# Patient Record
Sex: Female | Born: 2000 | Race: White | Hispanic: No | Smoking: Never smoker
Health system: Southern US, Community
[De-identification: ages and names within clinical notes are randomized; demographics above are authoritative.]

## PROBLEM LIST (undated history)

## (undated) DIAGNOSIS — J45909 Unspecified asthma, uncomplicated: Secondary | ICD-10-CM

## (undated) DIAGNOSIS — F419 Anxiety disorder, unspecified: Secondary | ICD-10-CM

## (undated) DIAGNOSIS — F32A Depression, unspecified: Secondary | ICD-10-CM

## (undated) HISTORY — DX: Depression, unspecified: F32.A

## (undated) HISTORY — DX: Anxiety disorder, unspecified: F41.9

## (undated) HISTORY — DX: Unspecified asthma, uncomplicated: J45.909

---
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
## 2019-07-23 ENCOUNTER — Ambulatory Visit: Payer: Self-pay | Attending: Internal Medicine

## 2019-07-23 ENCOUNTER — Ambulatory Visit: Payer: Self-pay

## 2019-07-23 DIAGNOSIS — Z23 Encounter for immunization: Secondary | ICD-10-CM

## 2019-07-23 NOTE — Progress Notes (Signed)
   Covid-19 Vaccination Clinic  Name:  Deborah Lopez    MRN: [id number] DOB: Feb 27, 2001  07/23/2019  Deborah Lopez was observed post Covid-19 immunization for 15 minutes without incident. She was provided with Vaccine Information Sheet and instruction to access the V-Safe system.   Deborah Lopez was instructed to call 911 with any severe reactions post vaccine: Marland Kitchen Difficulty breathing  . Swelling of face and throat  . A fast heartbeat  . A bad rash all over body  . Dizziness and weakness   Immunizations Administered    Name Date Dose VIS Date Route   Pfizer COVID-19 Vaccine 07/23/2019 12:01 PM 0.3 mL 03/18/2019 Intramuscular   Manufacturer: [employer], [employer]   Lot: [id number]   NDC: [address]-3

## 2019-08-23 ENCOUNTER — Ambulatory Visit: Payer: Self-pay | Attending: Internal Medicine

## 2019-08-23 DIAGNOSIS — Z23 Encounter for immunization: Secondary | ICD-10-CM

## 2019-08-23 NOTE — Progress Notes (Signed)
   Covid-19 Vaccination Clinic  Name:  LUISE YAMAMOTO    MRN: [id number] DOB: 2001/03/11  08/23/2019  Ms. Budreau was observed post Covid-19 immunization for 15 minutes without incident. She was provided with Vaccine Information Sheet and instruction to access the V-Safe system.   Ms. Bump was instructed to call 911 with any severe reactions post vaccine: Marland Kitchen Difficulty breathing  . Swelling of face and throat  . A fast heartbeat  . A bad rash all over body  . Dizziness and weakness   Immunizations Administered    Name Date Dose VIS Date Route   Pfizer COVID-19 Vaccine 08/23/2019  1:16 PM 0.3 mL 06/01/2018 Intramuscular   Manufacturer: [employer], [employer]   Lot: [id number]   NDC: [address]-8

## 2020-07-31 ENCOUNTER — Ambulatory Visit
Admission: EM | Admit: 2020-07-31 | Discharge: 2020-07-31 | Disposition: A | Discharge status: Home / Self Care | Payer: BLUE CROSS/BLUE SHIELD | Attending: Family Medicine | Admitting: Family Medicine

## 2020-07-31 ENCOUNTER — Encounter: Payer: Self-pay | Admitting: Emergency Medicine

## 2020-07-31 ENCOUNTER — Other Ambulatory Visit: Payer: Self-pay

## 2020-07-31 DIAGNOSIS — J0100 Acute maxillary sinusitis, unspecified: Secondary | ICD-10-CM | POA: Diagnosis not present

## 2020-07-31 MED ORDER — AMOXICILLIN-POT CLAVULANATE 875-125 MG PO TABS: 1.0000 | ORAL_TABLET | Freq: Two times a day (BID) | ORAL | 0 refills | Status: DC

## 2020-07-31 MED ORDER — FLUCONAZOLE 150 MG PO TABS: 150.0000 mg | ORAL_TABLET | Freq: Once | ORAL | 0 refills | Status: AC

## 2020-07-31 MED ORDER — CETIRIZINE-PSEUDOEPHEDRINE ER 5-120 MG PO TB12: 1.0000 | ORAL_TABLET | Freq: Two times a day (BID) | ORAL | 0 refills | Status: DC

## 2020-07-31 NOTE — ED Triage Notes (Signed)
Pt c/o nasal congestion, sore on the roof of her mouth, cough, and headache. Started about a week ago. Home covid test negative.

## 2020-07-31 NOTE — ED Provider Notes (Signed)
[facility]    CSN: [id number] Arrival date & time: 07/31/20  1340      History   Chief Complaint Chief Complaint  Patient presents with  . Nasal Congestion   HPI  20 year old female presents respiratory symptoms.  1 week history of symptoms.  She reports runny nose, congestion, sore throat, sores in roof her mouth.  Associated headache.  Also notes sinus pressure.  Home COVID test negative.  No fever.  Pain currently 2/10 in severity.  No relieving factors.  No medications or interventions tried.  No other complaints.  Home Medications    Prior to Admission medications   Medication Sig Start Date End Date Taking? Authorizing Provider  amoxicillin-clavulanate (AUGMENTIN) 875-125 MG tablet Take 1 tablet by mouth 2 (two) times daily. 07/31/20  Yes Shasta Chinn G, DO  cetirizine-pseudoephedrine (ZYRTEC-D) 5-120 MG tablet Take 1 tablet by mouth 2 (two) times daily. 07/31/20  Yes Pilar Westergaard G, DO  fluconazole (DIFLUCAN) 150 MG tablet Take 1 tablet (150 mg total) by mouth once for 1 dose. Repeat dose in 72 hours. 07/31/20 07/31/20 Yes Oma Alpert G, DO  JUNEL FE 1.5/30 1.5-30 MG-MCG tablet Take 1 tablet by mouth daily. 06/19/20   [provider]   Social History Social History   Tobacco Use  . Smoking status: Never Smoker  . Smokeless tobacco: Never Used  Vaping Use  . Vaping Use: Never used  Substance Use Topics  . Alcohol use: Not Currently  . Drug use: Not Currently     Allergies   Patient has no known allergies.   Review of Systems Review of Systems Per HPI  Physical Exam Triage Vital Signs ED Triage Vitals  Enc Vitals Group     BP 07/31/20 1400 124/77     Pulse Rate 07/31/20 1400 88     Resp 07/31/20 1400 18     Temp 07/31/20 1400 98.5 F (36.9 C)     Temp Source 07/31/20 1400 Oral     SpO2 07/31/20 1400 100 %     Weight 07/31/20 1403 175 lb (79.4 kg)     Height 07/31/20 1403 5\' 2"  (1.575 m)     Head Circumference --      Peak Flow --       Pain Score 07/31/20 1414 2     Pain Loc --      Pain Edu? --      Excl. in [place]? --    No data found.  Updated Vital Signs BP 124/77 (BP Location: Left Arm)   Pulse 88   Temp 98.5 F (36.9 C) (Oral)   Resp 18   Ht 5\' 2"  (1.575 m)   Wt 79.4 kg   LMP 07/10/2020 (Approximate)   SpO2 100%   BMI 32.01 kg/m   Visual Acuity Right Eye Distance:   Left Eye Distance:   Bilateral Distance:    Right Eye Near:   Left Eye Near:    Bilateral Near:     Physical Exam Vitals and nursing note reviewed.  Constitutional:      General: She is not in acute distress.    Appearance: Normal appearance. She is not ill-appearing.  HENT:     Head: Normocephalic and atraumatic.     Right Ear: Tympanic membrane normal.     Left Ear: Tympanic membrane normal.     Nose:     Comments: Maxillary sinus tenderness to palpation.    Mouth/Throat:     Pharynx: Oropharynx is  clear. No posterior oropharyngeal erythema.  Cardiovascular:     Rate and Rhythm: Normal rate and regular rhythm.     Heart sounds: No murmur heard.   Pulmonary:     Effort: Pulmonary effort is normal.     Breath sounds: Normal breath sounds. No wheezing, rhonchi or rales.  Musculoskeletal:     Cervical back: Neck supple.  Neurological:     Mental Status: She is alert.  Psychiatric:        Mood and Affect: Mood normal.        Behavior: Behavior normal.    UC Treatments / Results  Labs (all labs ordered are listed, but only abnormal results are displayed) Labs Reviewed - No data to display  EKG   Radiology No results found.  Procedures Procedures (including critical care time)  Medications Ordered in UC Medications - No data to display  Initial Impression / Assessment and Plan / UC Course  I have reviewed the triage vital signs and the nursing notes.  Pertinent labs & imaging results that were available during my care of the patient were reviewed by me and considered in my medical decision making (see  chart for details).    20 year old female presents with sinusitis.  Treating with Augmentin.  Also placing on Zyrtec-D. Diflucan if needed (for yeast vaginitis from antibiotic use).  Final Clinical Impressions(s) / UC Diagnoses   Final diagnoses:  Acute maxillary sinusitis, recurrence not specified   Discharge Instructions   None    ED Prescriptions    Medication Sig Dispense Auth. Provider   amoxicillin-clavulanate (AUGMENTIN) 875-125 MG tablet Take 1 tablet by mouth 2 (two) times daily. 20 tablet Marjarie Irion G, DO   fluconazole (DIFLUCAN) 150 MG tablet Take 1 tablet (150 mg total) by mouth once for 1 dose. Repeat dose in 72 hours. 2 tablet Niobe Dick G, DO   cetirizine-pseudoephedrine (ZYRTEC-D) 5-120 MG tablet Take 1 tablet by mouth 2 (two) times daily. 30 tablet Tommie Sams, DO     PDMP not reviewed this encounter.   Tommie Sams, [place] 07/31/20 1858

## 2021-05-24 DIAGNOSIS — Z01419 Encounter for gynecological examination (general) (routine) without abnormal findings: Secondary | ICD-10-CM | POA: Diagnosis not present

## 2021-05-24 DIAGNOSIS — Z1339 Encounter for screening examination for other mental health and behavioral disorders: Secondary | ICD-10-CM | POA: Diagnosis not present

## 2021-05-24 DIAGNOSIS — Z124 Encounter for screening for malignant neoplasm of cervix: Secondary | ICD-10-CM | POA: Diagnosis not present

## 2021-05-24 DIAGNOSIS — R69 Illness, unspecified: Secondary | ICD-10-CM | POA: Diagnosis not present

## 2021-05-24 DIAGNOSIS — Z1331 Encounter for screening for depression: Secondary | ICD-10-CM | POA: Diagnosis not present

## 2021-05-24 DIAGNOSIS — Z3041 Encounter for surveillance of contraceptive pills: Secondary | ICD-10-CM | POA: Diagnosis not present

## 2021-06-04 ENCOUNTER — Other Ambulatory Visit: Payer: Self-pay

## 2021-06-04 ENCOUNTER — Ambulatory Visit (LOCAL_COMMUNITY_HEALTH_CENTER): Payer: Self-pay

## 2021-06-04 DIAGNOSIS — Z111 Encounter for screening for respiratory tuberculosis: Secondary | ICD-10-CM

## 2021-06-07 ENCOUNTER — Ambulatory Visit (LOCAL_COMMUNITY_HEALTH_CENTER): Payer: Self-pay

## 2021-06-07 ENCOUNTER — Other Ambulatory Visit: Payer: Self-pay

## 2021-06-07 DIAGNOSIS — Z111 Encounter for screening for respiratory tuberculosis: Secondary | ICD-10-CM

## 2021-06-07 LAB — TB SKIN TEST
Induration: 0 mm
TB Skin Test: NEGATIVE

## 2021-06-20 DIAGNOSIS — J019 Acute sinusitis, unspecified: Secondary | ICD-10-CM | POA: Diagnosis not present

## 2021-07-04 DIAGNOSIS — Z00129 Encounter for routine child health examination without abnormal findings: Secondary | ICD-10-CM | POA: Diagnosis not present

## 2021-07-04 DIAGNOSIS — Z0000 Encounter for general adult medical examination without abnormal findings: Secondary | ICD-10-CM | POA: Diagnosis not present

## 2021-07-04 DIAGNOSIS — Z713 Dietary counseling and surveillance: Secondary | ICD-10-CM | POA: Diagnosis not present

## 2021-07-04 DIAGNOSIS — Z23 Encounter for immunization: Secondary | ICD-10-CM | POA: Diagnosis not present

## 2021-08-26 ENCOUNTER — Ambulatory Visit: Payer: 59 | Admitting: Internal Medicine

## 2021-08-26 ENCOUNTER — Encounter: Payer: Self-pay | Admitting: Internal Medicine

## 2021-08-26 DIAGNOSIS — F439 Reaction to severe stress, unspecified: Secondary | ICD-10-CM | POA: Diagnosis not present

## 2021-08-26 DIAGNOSIS — Z124 Encounter for screening for malignant neoplasm of cervix: Secondary | ICD-10-CM | POA: Diagnosis not present

## 2021-08-26 DIAGNOSIS — R69 Illness, unspecified: Secondary | ICD-10-CM | POA: Diagnosis not present

## 2021-08-26 DIAGNOSIS — J45909 Unspecified asthma, uncomplicated: Secondary | ICD-10-CM

## 2021-08-26 MED ORDER — SERTRALINE HCL 50 MG PO TABS: ORAL_TABLET | ORAL | 2 refills | Status: DC

## 2021-08-26 NOTE — Progress Notes (Signed)
Patient ID: Deborah Lopez, female   DOB: Jun 04, 2000, 21 y.o.   MRN: [id number]   Subjective:    Patient ID: Deborah Lopez, female    DOB: 07-10-2000, 21 y.o.   MRN: [id number]   Patient here to establish care.   Chief Complaint  Patient presents with   Establish Care    Pt presents to establish care   .   HPI Was being followed at [facility] -  Dr Princess Bruins. Diagnosed with asthma - elementary school.  No problems now.  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  Some sleep issues.  Discussed exercise.  [employer].  Yoga.  No sob.  No cough or congestion.  No acid reflux.  No abdominal pain.  Bowels moving.  Request to see gyn - Dr Jean Rosenthal - for pelvic exams.    Past Medical History:  Diagnosis Date   Anxiety    Asthma    Depression    History reviewed. No pertinent surgical history. Family History  Problem Relation Age of Onset   Arthritis Mother    Depression Mother    Hypertension Mother    Mental illness Mother    Miscarriages / [place] Mother    Alcohol abuse Father    Depression Father    Diabetes Father    Early death Father    Mental illness Father    Asthma Sister    Depression Sister    Anxiety disorder Sister    Miscarriages / [place] Sister    Mental illness Sister    Arthritis Maternal Grandmother    Cancer Maternal Grandfather    Heart attack Maternal Grandfather    Mental illness Paternal Grandmother    Social History   Socioeconomic History   Marital status: Single    Spouse name: Not on file   Number of children: Not on file   Years of education: Not on file   Highest education level: Not on file  Occupational History   Not on file  Tobacco Use   Smoking status: Never   Smokeless tobacco: Never  Vaping Use   Vaping Use: Never used  Substance and Sexual Activity   Alcohol use: Yes   Drug use: Never   Sexual activity: Not Currently    Birth control/protection: Pill    Comment: Junel  Other Topics Concern   Not on file   Social History Narrative   Not on file   Social Determinants of Health   Financial Resource Strain: Not on file  Food Insecurity: Not on file  Transportation Needs: Not on file  Physical Activity: Not on file  Stress: Not on file  Social Connections: Not on file     Review of Systems  Constitutional:  Negative for appetite change and unexpected weight change.  HENT:  Negative for congestion and sinus pressure.   Respiratory:  Negative for cough, chest tightness and shortness of breath.   Cardiovascular:  Negative for chest pain and palpitations.  Gastrointestinal:  Negative for abdominal pain, diarrhea, nausea and vomiting.  Genitourinary:  Negative for difficulty urinating and dysuria.  Musculoskeletal:  Negative for joint swelling and myalgias.  Skin:  Negative for color change and rash.  Neurological:  Negative for dizziness, light-headedness and headaches.  Psychiatric/Behavioral:  Negative for agitation and dysphoric mood.       Objective:     BP 124/70 (BP Location: Left Arm, Patient Position: Sitting, Cuff Size: Small)   Pulse 99   Temp (!) 97.1 F (36.2 C) (  Temporal)   Resp 16   Ht 5\' 1"  (1.549 m)   Wt 171 lb 6.4 oz (77.7 kg)   SpO2 98%   BMI 32.39 kg/m  Wt Readings from Last 3 Encounters:  08/26/21 171 lb 6.4 oz (77.7 kg)  07/31/20 175 lb (79.4 kg)    Physical Exam Vitals reviewed.  Constitutional:      General: She is not in acute distress.    Appearance: Normal appearance.  HENT:     Head: Normocephalic and atraumatic.     Right Ear: External ear normal.     Left Ear: External ear normal.  Eyes:     General: No scleral icterus.       Right eye: No discharge.        Left eye: No discharge.     Conjunctiva/sclera: Conjunctivae normal.  Neck:     Thyroid: No thyromegaly.  Cardiovascular:     Rate and Rhythm: Normal rate and regular rhythm.  Pulmonary:     Effort: No respiratory distress.     Breath sounds: Normal breath sounds. No wheezing.   Abdominal:     General: Bowel sounds are normal.     Palpations: Abdomen is soft.     Tenderness: There is no abdominal tenderness.  Musculoskeletal:        General: No swelling or tenderness.     Cervical back: Neck supple. No tenderness.  Lymphadenopathy:     Cervical: No cervical adenopathy.  Skin:    Findings: No erythema or rash.  Neurological:     Mental Status: She is alert.  Psychiatric:        Mood and Affect: Mood normal.        Behavior: Behavior normal.     Outpatient Encounter Medications as of 08/26/2021  Medication Sig   JUNEL FE 1.5/30 1.5-30 MG-MCG tablet Take 1 tablet by mouth daily.   sertraline (ZOLOFT) 50 MG tablet 1/2 tablet q day x 10 days and then one po q day   [DISCONTINUED] amoxicillin-clavulanate (AUGMENTIN) 875-125 MG tablet Take 1 tablet by mouth 2 (two) times daily. (Patient not taking: Reported on 08/26/2021)   [DISCONTINUED] cetirizine-pseudoephedrine (ZYRTEC-D) 5-120 MG tablet Take 1 tablet by mouth 2 (two) times daily. (Patient not taking: Reported on 08/26/2021)   No facility-administered encounter medications on file as of 08/26/2021.         Assessment & Plan:   Problem List Items Addressed This Visit     Asthma    Diagnosed as a child.  No problems now.  Follow.        Pap smear for cervical cancer screening    Request referral to gyn - Dr 08/28/2021 - for pelvic and pap smear.        Stress    Increased stress.  Discussed.  Discussed sleep. Does not feel needs any further intervention at this time.  Follow.          Jean Rosenthal, MD

## 2021-09-02 ENCOUNTER — Encounter: Payer: Self-pay | Admitting: Internal Medicine

## 2021-09-02 DIAGNOSIS — J45909 Unspecified asthma, uncomplicated: Secondary | ICD-10-CM | POA: Insufficient documentation

## 2021-09-02 DIAGNOSIS — Z124 Encounter for screening for malignant neoplasm of cervix: Secondary | ICD-10-CM | POA: Insufficient documentation

## 2021-09-02 DIAGNOSIS — F439 Reaction to severe stress, unspecified: Secondary | ICD-10-CM | POA: Insufficient documentation

## 2021-09-02 NOTE — Assessment & Plan Note (Signed)
Request referral to gyn - Dr Glennon Mac - for pelvic and pap smear.

## 2021-09-02 NOTE — Assessment & Plan Note (Signed)
Diagnosed as a child.  No problems now.  Follow.

## 2021-09-02 NOTE — Assessment & Plan Note (Signed)
Increased stress.  Discussed.  Discussed sleep. Does not feel needs any further intervention at this time.  Follow.

## 2021-09-05 DIAGNOSIS — H109 Unspecified conjunctivitis: Secondary | ICD-10-CM | POA: Diagnosis not present

## 2021-09-17 ENCOUNTER — Other Ambulatory Visit: Payer: Self-pay | Admitting: Internal Medicine

## 2021-09-27 ENCOUNTER — Ambulatory Visit
Admission: RE | Admit: 2021-09-27 | Discharge: 2021-09-27 | Disposition: A | Discharge status: Home / Self Care | Payer: 59 | Source: Ambulatory Visit | Attending: Physician Assistant | Admitting: Physician Assistant

## 2021-09-27 VITALS — BP 117/80 | HR 106 | Temp 98.6°F | Resp 18

## 2021-09-27 DIAGNOSIS — J0100 Acute maxillary sinusitis, unspecified: Secondary | ICD-10-CM

## 2021-09-27 DIAGNOSIS — R0981 Nasal congestion: Secondary | ICD-10-CM | POA: Diagnosis not present

## 2021-09-27 MED ORDER — AMOXICILLIN-POT CLAVULANATE 875-125 MG PO TABS: 1.0000 | ORAL_TABLET | Freq: Two times a day (BID) | ORAL | 0 refills | Status: AC

## 2021-10-09 ENCOUNTER — Ambulatory Visit: Payer: 59 | Admitting: Internal Medicine

## 2021-12-21 ENCOUNTER — Ambulatory Visit: Payer: Self-pay

## 2022-01-09 DIAGNOSIS — J019 Acute sinusitis, unspecified: Secondary | ICD-10-CM | POA: Diagnosis not present

## 2022-01-09 DIAGNOSIS — L0100 Impetigo, unspecified: Secondary | ICD-10-CM | POA: Diagnosis not present

## 2022-01-09 DIAGNOSIS — J029 Acute pharyngitis, unspecified: Secondary | ICD-10-CM | POA: Diagnosis not present

## 2022-01-09 DIAGNOSIS — R509 Fever, unspecified: Secondary | ICD-10-CM | POA: Diagnosis not present

## 2022-01-12 ENCOUNTER — Ambulatory Visit
Admission: EM | Admit: 2022-01-12 | Discharge: 2022-01-12 | Disposition: A | Discharge status: Home / Self Care | Payer: 59 | Attending: Emergency Medicine | Admitting: Emergency Medicine

## 2022-01-12 ENCOUNTER — Encounter: Payer: Self-pay | Admitting: Emergency Medicine

## 2022-01-12 DIAGNOSIS — J0100 Acute maxillary sinusitis, unspecified: Secondary | ICD-10-CM

## 2022-01-12 MED ORDER — AMOXICILLIN-POT CLAVULANATE 875-125 MG PO TABS: 1.0000 | ORAL_TABLET | Freq: Two times a day (BID) | ORAL | 0 refills | Status: AC

## 2022-01-12 MED ORDER — BENZONATATE 100 MG PO CAPS: 200.0000 mg | ORAL_CAPSULE | Freq: Three times a day (TID) | ORAL | 0 refills | Status: DC

## 2022-01-12 MED ORDER — PROMETHAZINE-DM 6.25-15 MG/5ML PO SYRP: 5.0000 mL | ORAL_SOLUTION | Freq: Four times a day (QID) | ORAL | 0 refills | Status: DC | PRN

## 2022-01-12 MED ORDER — IPRATROPIUM BROMIDE 0.06 % NA SOLN: 2.0000 | Freq: Four times a day (QID) | NASAL | 12 refills | Status: DC

## 2022-01-12 NOTE — ED Provider Notes (Signed)
[facility]    CSN: [id number] Arrival date & time: 01/12/22  1042      History   Chief Complaint Chief Complaint  Patient presents with   Nasal Congestion   Sinus Problem    HPI Deborah Lopez is a 21 y.o. female.   HPI  21 year old female here for evaluation of sinus complaints.  Patient reports that she has been experiencing sinus pain and pressure for last 2 weeks with thick green nasal discharge, postnasal drip, sore throat, and a productive cough for green sputum.  She denies any fever, ear pain, shortness of breath, or wheezing.  Past Medical History:  Diagnosis Date   Anxiety    Asthma    Depression     Patient Active Problem List   Diagnosis Date Noted   Asthma 09/02/2021   Stress 09/02/2021   Pap smear for cervical cancer screening 09/02/2021    History reviewed. No pertinent surgical history.  OB History   No obstetric history on file.      Home Medications    Prior to Admission medications   Medication Sig Start Date End Date Taking? Authorizing Provider  amoxicillin-clavulanate (AUGMENTIN) 875-125 MG tablet Take 1 tablet by mouth every 12 (twelve) hours for 10 days. 01/12/22 01/22/22 Yes Margarette Canada, NP  benzonatate (TESSALON) 100 MG capsule Take 2 capsules (200 mg total) by mouth every 8 (eight) hours. 01/12/22  Yes Margarette Canada, NP  ipratropium (ATROVENT) 0.06 % nasal spray Place 2 sprays into both nostrils 4 (four) times daily. 01/12/22  Yes Margarette Canada, NP  JUNEL FE 1.5/30 1.5-30 MG-MCG tablet Take 1 tablet by mouth daily. 06/19/20  Yes [provider]  promethazine-dextromethorphan (PROMETHAZINE-DM) 6.25-15 MG/5ML syrup Take 5 mLs by mouth 4 (four) times daily as needed. 01/12/22  Yes Margarette Canada, NP  sertraline (ZOLOFT) 50 MG tablet 1/2 TABLET EVERY DAY X 10 DAYS AND THEN ONE EVERY DAY 09/17/21  Yes Einar Pheasant, MD    Family History Family History  Problem Relation Age of Onset   Arthritis Mother    Depression  Mother    Hypertension Mother    Mental illness Mother    Miscarriages / [place] Mother    Alcohol abuse Father    Depression Father    Diabetes Father    Early death Father    Mental illness Father    Asthma Sister    Depression Sister    Anxiety disorder Sister    Miscarriages / [place] Sister    Mental illness Sister    Arthritis Maternal Grandmother    Cancer Maternal Grandfather    Heart attack Maternal Grandfather    Mental illness Paternal Grandmother     Social History Social History   Tobacco Use   Smoking status: Never   Smokeless tobacco: Never  Vaping Use   Vaping Use: Never used  Substance Use Topics   Alcohol use: Yes   Drug use: Never     Allergies   Patient has no known allergies.   Review of Systems Review of Systems  Constitutional:  Negative for fever.  HENT:  Positive for congestion, postnasal drip, rhinorrhea, sinus pressure, sinus pain and sore throat. Negative for ear pain.   Respiratory:  Positive for cough. Negative for shortness of breath and wheezing.      Physical Exam Triage Vital Signs ED Triage Vitals  Enc Vitals Group     BP 01/12/22 1052 112/75     Pulse Rate 01/12/22 1052 93  Resp 01/12/22 1052 14     Temp 01/12/22 1052 98.6 F (37 C)     Temp Source 01/12/22 1052 Oral     SpO2 01/12/22 1052 98 %     Weight 01/12/22 1049 168 lb (76.2 kg)     Height 01/12/22 1049 5' 1"  (1.549 m)     Head Circumference --      Peak Flow --      Pain Score 01/12/22 1049 8     Pain Loc --      Pain Edu? --      Excl. in [place]? --    No data found.  Updated Vital Signs BP 112/75 (BP Location: Right Arm)   Pulse 93   Temp 98.6 F (37 C) (Oral)   Resp 14   Ht 5' 1"  (1.549 m)   Wt 168 lb (76.2 kg)   LMP 12/29/2021 (Approximate)   SpO2 98%   BMI 31.74 kg/m   Visual Acuity Right Eye Distance:   Left Eye Distance:   Bilateral Distance:    Right Eye Near:   Left Eye Near:    Bilateral Near:     Physical  Exam Vitals and nursing note reviewed.  Constitutional:      Appearance: Normal appearance. She is not ill-appearing.  HENT:     Head: Normocephalic and atraumatic.     Right Ear: Tympanic membrane, ear canal and external ear normal. There is no impacted cerumen.     Left Ear: Tympanic membrane, ear canal and external ear normal. There is no impacted cerumen.     Nose: Congestion and rhinorrhea present.     Comments: Patient has marked edema and erythema of nasal mucosa bilaterally.  There is some purulent drainage in both nares as well.    Mouth/Throat:     Mouth: Mucous membranes are moist.     Pharynx: Oropharynx is clear. Posterior oropharyngeal erythema present. No oropharyngeal exudate.     Comments: Posterior oropharynx has erythema and injection.  Tonsillar pillars are unremarkable. Cardiovascular:     Rate and Rhythm: Normal rate and regular rhythm.     Pulses: Normal pulses.     Heart sounds: Normal heart sounds. No murmur heard.    No friction rub. No gallop.  Pulmonary:     Effort: Pulmonary effort is normal.     Breath sounds: Normal breath sounds. No wheezing, rhonchi or rales.  Musculoskeletal:     Cervical back: Normal range of motion and neck supple.  Lymphadenopathy:     Cervical: No cervical adenopathy.  Skin:    General: Skin is warm and dry.     Capillary Refill: Capillary refill takes less than 2 seconds.     Findings: No erythema or rash.  Neurological:     General: No focal deficit present.     Mental Status: She is alert and oriented to person, place, and time.  Psychiatric:        Mood and Affect: Mood normal.        Behavior: Behavior normal.        Thought Content: Thought content normal.        Judgment: Judgment normal.      UC Treatments / Results  Labs (all labs ordered are listed, but only abnormal results are displayed) Labs Reviewed - No data to display  EKG   Radiology No results found.  Procedures Procedures (including  critical care time)  Medications Ordered in UC Medications - No data to  display  Initial Impression / Assessment and Plan / UC Course  I have reviewed the triage vital signs and the nursing notes.  Pertinent labs & imaging results that were available during my care of the patient were reviewed by me and considered in my medical decision making (see chart for details).   Patient is a pleasant, nontoxic-appearing 21 year old female with a history of asthma and anxiety presenting for 2 weeks worth of sinus pain and pressure with purulent nasal discharge.  On exam patient has inflammation of her nasal mucosa and purulent discharge in both nares.  She also has erythema in the posterior oropharynx.  No lymphadenopathy on exam in the cervical region and her lungs are clear to auscultation.  I suspect the patient has a sinus infection and I will treat her for such with Augmentin twice daily for 10 days.  Also give her some Atrovent nasal spray to help with the nasal congestion and I suggest sinus irrigation to help alleviate the mucus burden.  I believe her cough is coming from drainage and I will treat her cough with Tessalon Perles and Promethazine DM cough syrup.  She denies need for work note.  Return precautions reviewed.   Final Clinical Impressions(s) / UC Diagnoses   Final diagnoses:  Acute non-recurrent maxillary sinusitis     Discharge Instructions      The Augmentin twice daily with food for 10 days for treatment of your sinusitis.  Perform sinus irrigation 2-3 times a day with a NeilMed sinus rinse kit and distilled water.  Do not use tap water.  You can use plain over-the-counter Mucinex every 6 hours to break up the stickiness of the mucus so your body can clear it.  Increase your oral fluid intake to thin out your mucus so that is also able for your body to clear more easily.  Take an over-the-counter probiotic, such as Culturelle-align-activia, 1 hour after each dose of  antibiotic to prevent diarrhea.  Use the Atrovent nasal spray, 2 squirts in each nostril every 6 hours, as needed for runny nose and postnasal drip.  Use the Tessalon Perles every 8 hours during the day.  Take them with a small sip of water.  They may give you some numbness to the base of your tongue or a metallic taste in your mouth, this is normal.  Use the Promethazine DM cough syrup at bedtime for cough and congestion.  It will make you drowsy so do not take it during the day.  If you develop any new or worsening symptoms return for reevaluation or see your primary care provider.      ED Prescriptions     Medication Sig Dispense Auth. Provider   amoxicillin-clavulanate (AUGMENTIN) 875-125 MG tablet Take 1 tablet by mouth every 12 (twelve) hours for 10 days. 20 tablet Margarette Canada, NP   ipratropium (ATROVENT) 0.06 % nasal spray Place 2 sprays into both nostrils 4 (four) times daily. 15 mL Margarette Canada, NP   benzonatate (TESSALON) 100 MG capsule Take 2 capsules (200 mg total) by mouth every 8 (eight) hours. 21 capsule Margarette Canada, NP   promethazine-dextromethorphan (PROMETHAZINE-DM) 6.25-15 MG/5ML syrup Take 5 mLs by mouth 4 (four) times daily as needed. 118 mL Margarette Canada, NP      PDMP not reviewed this encounter.   Margarette Canada, NP 01/12/22 1106

## 2022-01-12 NOTE — ED Triage Notes (Signed)
Patient c/o sinus congestion and pressure and nasal congestion that started 2 weeks ago.  Patient denies fevers.

## 2022-01-12 NOTE — Discharge Instructions (Signed)
The Augmentin twice daily with food for 10 days for treatment of your sinusitis.  Perform sinus irrigation 2-3 times a day with a NeilMed sinus rinse kit and distilled water.  Do not use tap water.  You can use plain over-the-counter Mucinex every 6 hours to break up the stickiness of the mucus so your body can clear it.  Increase your oral fluid intake to thin out your mucus so that is also able for your body to clear more easily.  Take an over-the-counter probiotic, such as Culturelle-align-activia, 1 hour after each dose of antibiotic to prevent diarrhea.  Use the Atrovent nasal spray, 2 squirts in each nostril every 6 hours, as needed for runny nose and postnasal drip.  Use the Tessalon Perles every 8 hours during the day.  Take them with a small sip of water.  They may give you some numbness to the base of your tongue or a metallic taste in your mouth, this is normal.  Use the Promethazine DM cough syrup at bedtime for cough and congestion.  It will make you drowsy so do not take it during the day.  If you develop any new or worsening symptoms return for reevaluation or see your primary care provider.  

## 2022-02-11 DIAGNOSIS — R87615 Unsatisfactory cytologic smear of cervix: Secondary | ICD-10-CM | POA: Diagnosis not present

## 2022-02-11 LAB — HM PAP SMEAR: HM Pap smear: NORMAL

## 2022-02-26 ENCOUNTER — Ambulatory Visit: Payer: 59 | Admitting: Internal Medicine

## 2022-03-18 ENCOUNTER — Ambulatory Visit
Admission: RE | Admit: 2022-03-18 | Discharge: 2022-03-18 | Disposition: A | Discharge status: Home / Self Care | Payer: 59 | Source: Ambulatory Visit | Attending: Internal Medicine | Admitting: Internal Medicine

## 2022-03-18 VITALS — BP 112/73 | HR 90 | Temp 98.0°F | Resp 16 | Ht 61.0 in | Wt 169.0 lb

## 2022-03-18 DIAGNOSIS — J029 Acute pharyngitis, unspecified: Secondary | ICD-10-CM | POA: Diagnosis not present

## 2022-03-18 DIAGNOSIS — J0100 Acute maxillary sinusitis, unspecified: Secondary | ICD-10-CM

## 2022-03-18 LAB — GROUP A STREP BY PCR: Group A Strep by PCR: NOT DETECTED

## 2022-03-18 MED ORDER — CLARITHROMYCIN ER 500 MG PO TB24: 1000.0000 mg | ORAL_TABLET | Freq: Every day | ORAL | 0 refills | Status: DC

## 2022-03-18 MED ORDER — BENZONATATE 200 MG PO CAPS: 200.0000 mg | ORAL_CAPSULE | Freq: Two times a day (BID) | ORAL | 0 refills | Status: DC | PRN

## 2022-03-18 NOTE — ED Triage Notes (Signed)
Chief Complaint: sore throat, runny nose, productive cough with green mucus. No fever, chills, or body aches.   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

## 2022-03-18 NOTE — ED Provider Notes (Signed)
[facility]    CSN: [id number] Arrival date & time: 03/18/22  1853      History   Chief Complaint Chief Complaint  Patient presents with   Sore Throat    I have had a sore throat for a week now and sinus congestion for three weeks. My cough has gotten very congested. I am constantly coughing, having a sore throat, and blowing my nose. - Entered by patient   URI    HPI Deborah Lopez is a 21 y.o. female who presents due to having URI onset 2.5 weeks ago. ST x 1 week, and nose congestion x 2 weeks of green color. The cough has been keeping her up at night. She denies having fever or aches at all.     Past Medical History:  Diagnosis Date   Anxiety    Asthma    Depression     Patient Active Problem List   Diagnosis Date Noted   Asthma 09/02/2021   Stress 09/02/2021   Pap smear for cervical cancer screening 09/02/2021    History reviewed. No pertinent surgical history.  OB History   No obstetric history on file.      Home Medications    Prior to Admission medications   Medication Sig Start Date End Date Taking? Authorizing Provider  benzonatate (TESSALON) 200 MG capsule Take 1 capsule (200 mg total) by mouth 2 (two) times daily as needed for cough. 03/18/22  Yes Rodriguez-Southworth, Nettie Elm, PA-C  clarithromycin (BIAXIN XL) 500 MG 24 hr tablet Take 2 tablets (1,000 mg total) by mouth daily. 03/18/22  Yes Rodriguez-Southworth, Nettie Elm, PA-C  JUNEL FE 1.5/30 1.5-30 MG-MCG tablet Take 1 tablet by mouth daily. 06/19/20  Yes [provider]  sertraline (ZOLOFT) 50 MG tablet 1/2 TABLET EVERY DAY X 10 DAYS AND THEN ONE EVERY DAY 09/17/21  Yes Dale Radium, MD    Family History Family History  Problem Relation Age of Onset   Arthritis Mother    Depression Mother    Hypertension Mother    Mental illness Mother    Miscarriages / [place] Mother    Alcohol abuse Father    Depression Father    Diabetes Father    Early death Father    Mental  illness Father    Asthma Sister    Depression Sister    Anxiety disorder Sister    Miscarriages / [place] Sister    Mental illness Sister    Arthritis Maternal Grandmother    Cancer Maternal Grandfather    Heart attack Maternal Grandfather    Mental illness Paternal Grandmother     Social History Social History   Tobacco Use   Smoking status: Never   Smokeless tobacco: Never  Vaping Use   Vaping Use: Never used  Substance Use Topics   Alcohol use: Yes   Drug use: Never     Allergies   Patient has no known allergies.   Review of Systems Review of Systems  Constitutional:  Positive for fatigue. Negative for activity change, appetite change, chills, diaphoresis and fever.  HENT:  Positive for congestion, postnasal drip, rhinorrhea, sinus pressure, sinus pain and sore throat. Negative for ear discharge and ear pain.   Eyes:  Negative for discharge.  Respiratory:  Positive for cough.   Musculoskeletal:  Negative for myalgias.  Neurological:  Negative for headaches.     Physical Exam Triage Vital Signs ED Triage Vitals  Enc Vitals Group     BP 03/18/22 1917 112/73  Pulse Rate 03/18/22 1917 90     Resp 03/18/22 1917 16     Temp 03/18/22 1917 98 F (36.7 C)     Temp Source 03/18/22 1917 Oral     SpO2 03/18/22 1917 100 %     Weight 03/18/22 1919 169 lb (76.7 kg)     Height 03/18/22 1919 5\' 1"  (1.549 m)     Head Circumference --      Peak Flow --      Pain Score 03/18/22 1916 8     Pain Loc --      Pain Edu? --      Excl. in [place]? --    No data found.  Updated Vital Signs BP 112/73 (BP Location: Left Arm)   Pulse 90   Temp 98 F (36.7 C) (Oral)   Resp 16   Ht 5\' 1"  (1.549 m)   Wt 169 lb (76.7 kg)   LMP 02/27/2022 (Approximate)   SpO2 100%   BMI 31.93 kg/m   Visual Acuity Right Eye Distance:   Left Eye Distance:   Bilateral Distance:    Right Eye Near:   Left Eye Near:    Bilateral Near:      Physical Exam Vitals signs and nursing note  reviewed.  Constitutional:      General: She is not in acute distress.    Appearance: Normal appearance. She is not ill-appearing, toxic-appearing or diaphoretic.  HENT:     Head: Normocephalic.     Right Ear: Tympanic membrane, ear canal and external ear normal.     Left Ear: Tympanic membrane, ear canal and external ear normal.     Nose: with moderate congestion and yellow mucous on each side. Has tender bilateral maxillary sinuses      Mouth/Throat: clear    Mouth: Mucous membranes are moist.  Eyes:     General: No scleral icterus.       Right eye: No discharge.        Left eye: No discharge.     Conjunctiva/sclera: Conjunctivae normal.  Neck:     Musculoskeletal: Neck supple. No neck rigidity.  Cardiovascular:     Rate and Rhythm: Normal rate and regular rhythm.     Heart sounds: No murmur.  Pulmonary:     Effort: Pulmonary effort is normal.     Breath sounds: Normal breath sounds.  Musculoskeletal: Normal range of motion.  Lymphadenopathy:     Cervical: No cervical adenopathy.  Skin:    General: Skin is warm and dry.     Coloration: Skin is not jaundiced.     Findings: No rash.  Neurological:     Mental Status: She is alert and oriented to person, place, and time.     Gait: Gait normal.  Psychiatric:        Mood and Affect: Mood normal.        Behavior: Behavior normal.        Thought Content: Thought content normal.        Judgment: Judgment normal.    UC Treatments / Results  Labs (all labs ordered are listed, but only abnormal results are displayed) Labs Reviewed  GROUP A STREP BY PCR  Strep PCR negative  EKG   Radiology No results found.  Procedures Procedures (including critical care time)  Medications Ordered in UC Medications - No data to display  Initial Impression / Assessment and Plan / UC Course  I have reviewed the triage vital signs and the  nursing notes.  Pertinent labs results that were available during my care of the patient were  reviewed by me and considered in my medical decision making (see chart for details).  Maxillary sinusitis Bronchitis  I placed her on Tessalon and Biaxen as noted.    Final Clinical Impressions(s) / UC Diagnoses   Final diagnoses:  Acute non-recurrent maxillary sinusitis   Discharge Instructions   None    ED Prescriptions     Medication Sig Dispense Auth. Provider   clarithromycin (BIAXIN XL) 500 MG 24 hr tablet Take 2 tablets (1,000 mg total) by mouth daily. 7 tablet Rodriguez-Southworth, Florence Yeung, PA-C   benzonatate (TESSALON) 200 MG capsule Take 1 capsule (200 mg total) by mouth 2 (two) times daily as needed for cough. 30 capsule Rodriguez-Southworth, Nettie Elm, [place]      I have reviewed the PDMP during this encounter.   Garey Ham, [place] 03/18/22 2054

## 2022-03-22 ENCOUNTER — Other Ambulatory Visit: Payer: Self-pay | Admitting: Internal Medicine

## 2022-03-25 ENCOUNTER — Encounter: Payer: Self-pay | Admitting: *Deleted

## 2022-03-25 NOTE — Telephone Encounter (Signed)
Sent My chart message needs appt for further refills.

## 2022-04-16 ENCOUNTER — Other Ambulatory Visit: Payer: Self-pay | Admitting: Internal Medicine

## 2022-04-28 ENCOUNTER — Other Ambulatory Visit: Payer: Self-pay

## 2022-04-28 ENCOUNTER — Ambulatory Visit
Admission: RE | Admit: 2022-04-28 | Discharge: 2022-04-28 | Disposition: A | Discharge status: Home / Self Care | Payer: 59 | Source: Ambulatory Visit | Attending: Emergency Medicine | Admitting: Emergency Medicine

## 2022-04-28 VITALS — BP 122/85 | HR 90 | Temp 98.7°F | Resp 16 | Ht 61.0 in | Wt 168.9 lb

## 2022-04-28 DIAGNOSIS — J0140 Acute pansinusitis, unspecified: Secondary | ICD-10-CM | POA: Diagnosis not present

## 2022-04-28 MED ORDER — PROMETHAZINE-DM 6.25-15 MG/5ML PO SYRP: 5.0000 mL | ORAL_SOLUTION | Freq: Four times a day (QID) | ORAL | 0 refills | Status: DC | PRN

## 2022-04-28 MED ORDER — IPRATROPIUM BROMIDE 0.06 % NA SOLN: 2.0000 | Freq: Four times a day (QID) | NASAL | 12 refills | Status: DC

## 2022-04-28 MED ORDER — BENZONATATE 100 MG PO CAPS: 200.0000 mg | ORAL_CAPSULE | Freq: Three times a day (TID) | ORAL | 0 refills | Status: DC

## 2022-04-28 MED ORDER — AMOXICILLIN-POT CLAVULANATE 875-125 MG PO TABS: 1.0000 | ORAL_TABLET | Freq: Two times a day (BID) | ORAL | 0 refills | Status: AC

## 2022-04-28 NOTE — ED Triage Notes (Signed)
Pt with cough productive of green phlegm, headache "from coughing so hard",  sore throat, nasal drainage. Pt was diagnosed with bronchitis a month ago. Most recent sx for 3 weeks.

## 2022-04-28 NOTE — Discharge Instructions (Addendum)
The Augmentin twice daily with food for 10 days for treatment of your sinusitis.  Perform sinus irrigation 2-3 times a day with a NeilMed sinus rinse kit and distilled water.  Do not use tap water.  You can use plain over-the-counter Mucinex every 6 hours to break up the stickiness of the mucus so your body can clear it.  Increase your oral fluid intake to thin out your mucus so that is also able for your body to clear more easily.  Take an over-the-counter probiotic, such as Culturelle-align-activia, 1 hour after each dose of antibiotic to prevent diarrhea.  Use the Atrovent nasal spray, 2 squirts in each nostril every 6 hours, as needed for runny nose and postnasal drip.  Use the Tessalon Perles every 8 hours during the day.  Take them with a small sip of water.  They may give you some numbness to the base of your tongue or a metallic taste in your mouth, this is normal.  Use the Promethazine DM cough syrup at bedtime for cough and congestion.  It will make you drowsy so do not take it during the day.  If you develop any new or worsening symptoms return for reevaluation or see your primary care provider.  

## 2022-04-28 NOTE — ED Provider Notes (Signed)
[facility]    CSN: [id number] Arrival date & time: 04/28/22  1541      History   Chief Complaint Chief Complaint  Patient presents with   Cough   Nasal Congestion   Sore Throat    HPI PIETRINA JAGODZINSKI is a 22 y.o. female.   HPI  22 year old female here for evaluation of respiratory complaints.  The patient reports that for the last 3 weeks she has been experiencing upper and lower sinus pressure with nasal congestion and green nasal discharge, sore throat, headache, and a cough that is productive for green sputum.  She also reports that she is had intermittent fevers but does not have one currently.  She denies any ear pain, shortness of breath, or wheezing.  Past Medical History:  Diagnosis Date   Anxiety    Asthma    Depression     Patient Active Problem List   Diagnosis Date Noted   Asthma 09/02/2021   Stress 09/02/2021   Pap smear for cervical cancer screening 09/02/2021    History reviewed. No pertinent surgical history.  OB History   No obstetric history on file.      Home Medications    Prior to Admission medications   Medication Sig Start Date End Date Taking? Authorizing Provider  amoxicillin-clavulanate (AUGMENTIN) 875-125 MG tablet Take 1 tablet by mouth every 12 (twelve) hours for 10 days. 04/28/22 05/08/22 Yes Becky Augusta, NP  benzonatate (TESSALON) 100 MG capsule Take 2 capsules (200 mg total) by mouth every 8 (eight) hours. 04/28/22  Yes Becky Augusta, NP  ipratropium (ATROVENT) 0.06 % nasal spray Place 2 sprays into both nostrils 4 (four) times daily. 04/28/22  Yes Becky Augusta, NP  promethazine-dextromethorphan (PROMETHAZINE-DM) 6.25-15 MG/5ML syrup Take 5 mLs by mouth 4 (four) times daily as needed. 04/28/22  Yes Becky Augusta, NP  clarithromycin (BIAXIN XL) 500 MG 24 hr tablet Take 2 tablets (1,000 mg total) by mouth daily. 03/18/22   Rodriguez-Southworth, Nettie Elm, PA-C  JUNEL FE 1.5/30 1.5-30 MG-MCG tablet Take 1 tablet by mouth daily.  06/19/20   [provider]  sertraline (ZOLOFT) 50 MG tablet TAKE 1/2 TABLET BY MOUTH DAILY X 10 DAYS AND THEN ONE EVERY DAY 04/16/22   Dale [place], MD    Family History Family History  Problem Relation Age of Onset   Arthritis Mother    Depression Mother    Hypertension Mother    Mental illness Mother    Miscarriages / [place] Mother    Alcohol abuse Father    Depression Father    Diabetes Father    Early death Father    Mental illness Father    Asthma Sister    Depression Sister    Anxiety disorder Sister    Miscarriages / [place] Sister    Mental illness Sister    Arthritis Maternal Grandmother    Cancer Maternal Grandfather    Heart attack Maternal Grandfather    Mental illness Paternal Grandmother     Social History Social History   Tobacco Use   Smoking status: Never   Smokeless tobacco: Never  Vaping Use   Vaping Use: Never used  Substance Use Topics   Alcohol use: Yes    Comment: social   Drug use: Never     Allergies   Patient has no known allergies.   Review of Systems Review of Systems  Constitutional:  Positive for fever.  HENT:  Positive for congestion, rhinorrhea, sinus pressure, sinus pain and sore throat.  Negative for ear pain.   Respiratory:  Positive for cough. Negative for shortness of breath and wheezing.   Hematological: Negative.   Psychiatric/Behavioral: Negative.       Physical Exam Triage Vital Signs ED Triage Vitals  Enc Vitals Group     BP 04/28/22 1638 122/85     Pulse Rate 04/28/22 1638 90     Resp 04/28/22 1638 16     Temp 04/28/22 1638 98.7 F (37.1 C)     Temp Source 04/28/22 1638 Oral     SpO2 04/28/22 1638 100 %     Weight 04/28/22 1640 168 lb 14 oz (76.6 kg)     Height 04/28/22 1640 5\' 1"  (1.549 m)     Head Circumference --      Peak Flow --      Pain Score 04/28/22 1639 7     Pain Loc --      Pain Edu? --      Excl. in [place]? --    No data found.  Updated Vital Signs BP 122/85 (BP  Location: Left Arm)   Pulse 90   Temp 98.7 F (37.1 C) (Oral)   Resp 16   Ht 5\' 1"  (1.549 m)   Wt 168 lb 14 oz (76.6 kg)   LMP 04/21/2022   SpO2 100%   BMI 31.91 kg/m   Visual Acuity Right Eye Distance:   Left Eye Distance:   Bilateral Distance:    Right Eye Near:   Left Eye Near:    Bilateral Near:     Physical Exam Vitals and nursing note reviewed.  Constitutional:      Appearance: Normal appearance. She is not ill-appearing.  HENT:     Head: Normocephalic and atraumatic.     Right Ear: Tympanic membrane, ear canal and external ear normal. There is no impacted cerumen.     Left Ear: Tympanic membrane, ear canal and external ear normal. There is no impacted cerumen.     Nose: Congestion and rhinorrhea present.     Comments: Haze Rushing is erythematous and edematous with thick yellow discharge in both nares.  She has tenderness to compression of frontal and maxillary sinuses bilaterally.    Mouth/Throat:     Mouth: Mucous membranes are moist.     Pharynx: Oropharynx is clear. Posterior oropharyngeal erythema present. No oropharyngeal exudate.     Comments: Posterior oropharynx has erythema and injection with yellow postnasal drip. Cardiovascular:     Rate and Rhythm: Normal rate and regular rhythm.     Pulses: Normal pulses.     Heart sounds: Normal heart sounds. No murmur heard.    No friction rub. No gallop.  Pulmonary:     Effort: Pulmonary effort is normal.     Breath sounds: Normal breath sounds. No wheezing, rhonchi or rales.  Musculoskeletal:     Cervical back: Normal range of motion and neck supple.  Lymphadenopathy:     Cervical: No cervical adenopathy.  Skin:    General: Skin is warm and dry.     Capillary Refill: Capillary refill takes less than 2 seconds.     Findings: No erythema or rash.  Neurological:     General: No focal deficit present.     Mental Status: She is alert and oriented to person, place, and time.  Psychiatric:        Mood and Affect: Mood  normal.        Behavior: Behavior normal.  Thought Content: Thought content normal.        Judgment: Judgment normal.      UC Treatments / Results  Labs (all labs ordered are listed, but only abnormal results are displayed) Labs Reviewed - No data to display  EKG   Radiology No results found.  Procedures Procedures (including critical care time)  Medications Ordered in UC Medications - No data to display  Initial Impression / Assessment and Plan / UC Course  I have reviewed the triage vital signs and the nursing notes.  Pertinent labs & imaging results that were available during my care of the patient were reviewed by me and considered in my medical decision making (see chart for details).   Patient is a pleasant, nontoxic-appearing 22 year old female here for evaluation of 3 weeks worth of respiratory and sinus complaints as outlined HPI above.  On exam patient does have inflammation and erythema of her nasal mucosa with yellow discharge in both nares.  She does have tenderness to compression of frontal maxillary sinuses bilaterally.  There is yellow postnasal drip present on oropharyngeal exam as well.  Her cardiopulmonary exam reveals clear lung sounds in all fields.  I believe the patient has pansinusitis and I will treat her accordingly with Augmentin 875 twice daily for 10 days.  I believe her cough is being driven by her postnasal drip so I will prescribe Atrovent nasal spray that she can use 4 times a day to help with the nasal congestion and postnasal drip.  I have also prescribed Tessalon Perles and Promethazine DM cough syrup she can use for cough and congestion.  We discussed using sinus irrigation to help alleviate her mucus burden and facilitate a decrease in pressure in her sinuses cavities.  She denies need for work note.  Return precautions reviewed.   Final Clinical Impressions(s) / UC Diagnoses   Final diagnoses:  Acute non-recurrent pansinusitis      Discharge Instructions      The Augmentin twice daily with food for 10 days for treatment of your sinusitis.  Perform sinus irrigation 2-3 times a day with a NeilMed sinus rinse kit and distilled water.  Do not use tap water.  You can use plain over-the-counter Mucinex every 6 hours to break up the stickiness of the mucus so your body can clear it.  Increase your oral fluid intake to thin out your mucus so that is also able for your body to clear more easily.  Take an over-the-counter probiotic, such as Culturelle-align-activia, 1 hour after each dose of antibiotic to prevent diarrhea.  Use the Atrovent nasal spray, 2 squirts in each nostril every 6 hours, as needed for runny nose and postnasal drip.  Use the Tessalon Perles every 8 hours during the day.  Take them with a small sip of water.  They may give you some numbness to the base of your tongue or a metallic taste in your mouth, this is normal.  Use the Promethazine DM cough syrup at bedtime for cough and congestion.  It will make you drowsy so do not take it during the day.  If you develop any new or worsening symptoms return for reevaluation or see your primary care provider.      ED Prescriptions     Medication Sig Dispense Auth. Provider   amoxicillin-clavulanate (AUGMENTIN) 875-125 MG tablet Take 1 tablet by mouth every 12 (twelve) hours for 10 days. 20 tablet Becky Augusta, NP   benzonatate (TESSALON) 100 MG capsule Take 2 capsules (  200 mg total) by mouth every 8 (eight) hours. 21 capsule Margarette Canada, NP   ipratropium (ATROVENT) 0.06 % nasal spray Place 2 sprays into both nostrils 4 (four) times daily. 15 mL Margarette Canada, NP   promethazine-dextromethorphan (PROMETHAZINE-DM) 6.25-15 MG/5ML syrup Take 5 mLs by mouth 4 (four) times daily as needed. 118 mL Margarette Canada, NP      PDMP not reviewed this encounter.   Margarette Canada, NP 04/28/22 1705

## 2022-05-26 ENCOUNTER — Encounter: Payer: 59 | Admitting: Internal Medicine

## 2022-05-26 NOTE — Progress Notes (Deleted)
Subjective:    Patient ID: Deborah Lopez, female    DOB: [birth year], 22 y.o.   MRN: [id number]  Patient here for No chief complaint on file.   HPI Here for her physical exam.  Diagnosed with asthma - elementary school.   Seen 04/28/22 - urgent care - dx with pansinusitis.  Treated with augmentin and atrovent nasal spray and tessalon perles.  Saw Dr Glennon Mac - f/u pap - 02/11/22 - negative    Past Medical History:  Diagnosis Date   Anxiety    Asthma    Depression    No past surgical history on file. Family History  Problem Relation Age of Onset   Arthritis Mother    Depression Mother    Hypertension Mother    Mental illness Mother    Miscarriages / [place] Mother    Alcohol abuse Father    Depression Father    Diabetes Father    Early death Father    Mental illness Father    Asthma Sister    Depression Sister    Anxiety disorder Sister    35 / [place] Sister    Mental illness Sister    Arthritis Maternal Grandmother    Cancer Maternal Grandfather    Heart attack Maternal Grandfather    Mental illness Paternal Grandmother    Social History   Socioeconomic History   Marital status: Single    Spouse name: Not on file   Number of children: Not on file   Years of education: Not on file   Highest education level: Not on file  Occupational History   Not on file  Tobacco Use   Smoking status: Never   Smokeless tobacco: Never  Vaping Use   Vaping Use: Never used  Substance and Sexual Activity   Alcohol use: Yes    Comment: social   Drug use: Never   Sexual activity: Not Currently    Birth control/protection: Pill    Comment: Junel  Other Topics Concern   Not on file  Social History Narrative   Not on file   Social Determinants of Health   Financial Resource Strain: Not on file  Food Insecurity: Not on file  Transportation Needs: Not on file  Physical Activity: Not on file  Stress: Not on file  Social Connections: Not on file      Review of Systems     Objective:     LMP 04/21/2022  Wt Readings from Last 3 Encounters:  04/28/22 168 lb 14 oz (76.6 kg)  03/18/22 169 lb (76.7 kg)  01/12/22 168 lb (76.2 kg)    Physical Exam   Outpatient Encounter Medications as of 05/26/2022  Medication Sig   benzonatate (TESSALON) 100 MG capsule Take 2 capsules (200 mg total) by mouth every 8 (eight) hours.   clarithromycin (BIAXIN XL) 500 MG 24 hr tablet Take 2 tablets (1,000 mg total) by mouth daily.   ipratropium (ATROVENT) 0.06 % nasal spray Place 2 sprays into both nostrils 4 (four) times daily.   JUNEL FE 1.5/30 1.5-30 MG-MCG tablet Take 1 tablet by mouth daily.   promethazine-dextromethorphan (PROMETHAZINE-DM) 6.25-15 MG/5ML syrup Take 5 mLs by mouth 4 (four) times daily as needed.   sertraline (ZOLOFT) 50 MG tablet TAKE 1/2 TABLET BY MOUTH DAILY X 10 DAYS AND THEN ONE EVERY DAY   No facility-administered encounter medications on file as of 05/26/2022.     No results found for: "WBC", "HGB", "HCT", "PLT", "GLUCOSE", "CHOL", "TRIG", "HDL", "LDLDIRECT", "LDLCALC", "  ALT", "AST", "NA", "K", "CL", "CREATININE", "BUN", "CO2", "TSH", "PSA", "INR", "GLUF", "HGBA1C", "MICROALBUR"  No results found.     Assessment & Plan:  There are no diagnoses linked to this encounter.   Einar Pheasant, MD

## 2022-06-22 ENCOUNTER — Ambulatory Visit
Admission: RE | Admit: 2022-06-22 | Discharge: 2022-06-22 | Disposition: A | Discharge status: Home / Self Care | Payer: Medicaid Other | Source: Ambulatory Visit | Attending: Emergency Medicine | Admitting: Emergency Medicine

## 2022-06-22 VITALS — BP 116/83 | HR 103 | Temp 98.4°F | Ht 61.0 in | Wt 165.0 lb

## 2022-06-22 DIAGNOSIS — J0140 Acute pansinusitis, unspecified: Secondary | ICD-10-CM

## 2022-06-22 MED ORDER — PROMETHAZINE-DM 6.25-15 MG/5ML PO SYRP: 5.0000 mL | ORAL_SOLUTION | Freq: Four times a day (QID) | ORAL | 0 refills | Status: DC | PRN

## 2022-06-22 MED ORDER — AMOXICILLIN-POT CLAVULANATE 875-125 MG PO TABS: 1.0000 | ORAL_TABLET | Freq: Two times a day (BID) | ORAL | 0 refills | Status: AC

## 2022-06-22 MED ORDER — BENZONATATE 100 MG PO CAPS: 200.0000 mg | ORAL_CAPSULE | Freq: Three times a day (TID) | ORAL | 0 refills | Status: DC

## 2022-06-22 NOTE — ED Triage Notes (Signed)
Pt c/o cough, nasal congestion x2weeks  Pt works at a preschool

## 2022-06-22 NOTE — Discharge Instructions (Addendum)
Begin Augmentin every morning and every evening for 10 days, daily see improvement in about 48 hours and steady progression from there  May use Tessalon Perles every 8 hours as needed to help calm your coughing  You may use cough syrup additionally every 6 hours, This may make you feel drowsyYou can take Tylenol and/or Ibuprofen as needed for fever reduction and pain relief.   For cough: honey 1/2 to 1 teaspoon (you can dilute the honey in water or another fluid).  You can also use guaifenesin and dextromethorphan for cough. You can use a humidifier for chest congestion and cough.  If you don't have a humidifier, you can sit in the bathroom with the hot shower running.      For sore throat: try warm salt water gargles, cepacol lozenges, throat spray, warm tea or water with lemon/honey, popsicles or ice, or OTC cold relief medicine for throat discomfort.   For congestion: take a daily anti-histamine like Zyrtec, Claritin, and a oral decongestant, such as pseudoephedrine.  You can also use Flonase 1-2 sprays in each nostril daily.   It is important to stay hydrated: drink plenty of fluids (water, gatorade/powerade/pedialyte, juices, or teas) to keep your throat moisturized and help further relieve irritation/discomfort.

## 2022-06-22 NOTE — ED Provider Notes (Signed)
[facility]    CSN: [id number] Arrival date & time: 06/22/22  [id number]      History   Chief Complaint Chief Complaint  Patient presents with   Nasal Congestion    Entered by patient   Cough    HPI Deborah Lopez is a 22 y.o. female.   Patient presents for evaluation of nasal congestion, rhinorrhea, productive cough with green sputum, intermittent generalized headaches, sinus pain and pressure primarily underneath the eyes and a sore throat present for 2 weeks.  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  Tolerating food and liquids.  Has attempted use of ibuprofen and nasal spray which has been minimally effective.  Denies fevers, shortness of breath or wheezing.  History of asthma.   Past Medical History:  Diagnosis Date   Anxiety    Asthma    Depression     Patient Active Problem List   Diagnosis Date Noted   Asthma 09/02/2021   Stress 09/02/2021   Pap smear for cervical cancer screening 09/02/2021    History reviewed. No pertinent surgical history.  OB History   No obstetric history on file.      Home Medications    Prior to Admission medications   Medication Sig Start Date End Date Taking? Authorizing Provider  sertraline (ZOLOFT) 50 MG tablet TAKE 1/2 TABLET BY MOUTH DAILY X 10 DAYS AND THEN ONE EVERY DAY 04/16/22  Yes Einar Pheasant, MD  benzonatate (TESSALON) 100 MG capsule Take 2 capsules (200 mg total) by mouth every 8 (eight) hours. 04/28/22   Margarette Canada, NP  clarithromycin (BIAXIN XL) 500 MG 24 hr tablet Take 2 tablets (1,000 mg total) by mouth daily. 03/18/22   Rodriguez-Southworth, Sunday Spillers, PA-C  ipratropium (ATROVENT) 0.06 % nasal spray Place 2 sprays into both nostrils 4 (four) times daily. 04/28/22   Margarette Canada, NP  JUNEL FE 1.5/30 1.5-30 MG-MCG tablet Take 1 tablet by mouth daily. 06/19/20   [provider]  promethazine-dextromethorphan (PROMETHAZINE-DM) 6.25-15 MG/5ML syrup Take 5 mLs by mouth 4 (four) times daily as  needed. 04/28/22   Margarette Canada, NP    Family History Family History  Problem Relation Age of Onset   Arthritis Mother    Depression Mother    Hypertension Mother    Mental illness Mother    Miscarriages / [place] Mother    Alcohol abuse Father    Depression Father    Diabetes Father    Early death Father    Mental illness Father    Asthma Sister    Depression Sister    Anxiety disorder Sister    Miscarriages / [place] Sister    Mental illness Sister    Arthritis Maternal Grandmother    Cancer Maternal Grandfather    Heart attack Maternal Grandfather    Mental illness Paternal Grandmother     Social History Social History   Tobacco Use   Smoking status: Never   Smokeless tobacco: Never  Vaping Use   Vaping Use: Never used  Substance Use Topics   Alcohol use: Yes    Comment: social   Drug use: Never     Allergies   Patient has no known allergies.   Review of Systems Review of Systems  Constitutional: Negative.   HENT:  Positive for congestion, rhinorrhea, sinus pressure, sinus pain and sore throat. Negative for dental problem, drooling, ear discharge, ear pain, facial swelling, hearing loss, mouth sores, nosebleeds, postnasal drip, sneezing, tinnitus, trouble swallowing and voice change.  Respiratory:  Positive for cough. Negative for apnea, choking, chest tightness, shortness of breath, wheezing and stridor.   Cardiovascular: Negative.   Gastrointestinal: Negative.   Neurological:  Positive for headaches. Negative for dizziness, tremors, seizures, syncope, facial asymmetry, speech difficulty, weakness, light-headedness and numbness.     Physical Exam Triage Vital Signs ED Triage Vitals  Enc Vitals Group     BP 06/22/22 0941 116/83     Pulse Rate 06/22/22 0941 (!) 103     Resp --      Temp 06/22/22 0941 98.4 F (36.9 C)     Temp Source 06/22/22 0941 Oral     SpO2 06/22/22 0941 93 %     Weight 06/22/22 0939 165 lb (74.8 kg)     Height  06/22/22 0939 5\' 1"  (1.549 m)     Head Circumference --      Peak Flow --      Pain Score 06/22/22 0939 6     Pain Loc --      Pain Edu? --      Excl. in Impact? --    No data found.  Updated Vital Signs BP 116/83 (BP Location: Left Arm)   Pulse (!) 103   Temp 98.4 F (36.9 C) (Oral)   Ht 5\' 1"  (1.549 m)   Wt 165 lb (74.8 kg)   LMP 05/18/2022   SpO2 93%   BMI 31.18 kg/m   Visual Acuity Right Eye Distance:   Left Eye Distance:   Bilateral Distance:    Right Eye Near:   Left Eye Near:    Bilateral Near:     Physical Exam Constitutional:      Appearance: Normal appearance.  HENT:     Head: Normocephalic.     Right Ear: Tympanic membrane, ear canal and external ear normal.     Left Ear: Tympanic membrane, ear canal and external ear normal.     Nose: Congestion and rhinorrhea present.     Right Turbinates: Swollen.     Right Sinus: Maxillary sinus tenderness and frontal sinus tenderness present.     Left Sinus: Maxillary sinus tenderness and frontal sinus tenderness present.     Mouth/Throat:     Mouth: Mucous membranes are moist.     Pharynx: No posterior oropharyngeal erythema.  Eyes:     Extraocular Movements: Extraocular movements intact.  Cardiovascular:     Rate and Rhythm: Normal rate and regular rhythm.     Pulses: Normal pulses.     Heart sounds: Normal heart sounds.  Pulmonary:     Effort: Pulmonary effort is normal.     Breath sounds: Normal breath sounds.  Skin:    General: Skin is warm and dry.  Neurological:     Mental Status: She is alert and oriented to person, place, and time. Mental status is at baseline.  Psychiatric:        Mood and Affect: Mood normal.        Behavior: Behavior normal.      UC Treatments / Results  Labs (all labs ordered are listed, but only abnormal results are displayed) Labs Reviewed - No data to display  EKG   Radiology No results found.  Procedures Procedures (including critical care time)  Medications  Ordered in UC Medications - No data to display  Initial Impression / Assessment and Plan / UC Course  I have reviewed the triage vital signs and the nursing notes.  Pertinent labs & imaging results that were available  during my care of the patient were reviewed by me and considered in my medical decision making (see chart for details).  Acute nonrecurrent pansinusitis  Patient is in no signs of distress nor toxic appearing.  Vital signs are stable.  Low suspicion for pneumonia, pneumothorax or bronchitis and therefore will defer imaging.  Presentation and symptomology is consistent with above diagnosis.  Prescribed Augmentin, Tessalon and Promethazine DM. May use additional over-the-counter medications as needed for supportive care.  May follow-up with urgent care as needed if symptoms persist or worsen.   Final Clinical Impressions(s) / UC Diagnoses   Final diagnoses:  None     Discharge Instructions      Begin Augmentin every morning and every evening for 10 days, daily see improvement in about 48 hours and steady progression from the    You can take Tylenol and/or Ibuprofen as needed for fever reduction and pain relief.   For cough: honey 1/2 to 1 teaspoon (you can dilute the honey in water or another fluid).  You can also use guaifenesin and dextromethorphan for cough. You can use a humidifier for chest congestion and cough.  If you don't have a humidifier, you can sit in the bathroom with the hot shower running.      For sore throat: try warm salt water gargles, cepacol lozenges, throat spray, warm tea or water with lemon/honey, popsicles or ice, or OTC cold relief medicine for throat discomfort.   For congestion: take a daily anti-histamine like Zyrtec, Claritin, and a oral decongestant, such as pseudoephedrine.  You can also use Flonase 1-2 sprays in each nostril daily.   It is important to stay hydrated: drink plenty of fluids (water, gatorade/powerade/pedialyte, juices, or  teas) to keep your throat moisturized and help further relieve irritation/discomfort.    ED Prescriptions   None    PDMP not reviewed this encounter.   Hans Eden, [place] 06/22/22 [phone]

## 2022-07-26 ENCOUNTER — Ambulatory Visit
Admission: RE | Admit: 2022-07-26 | Discharge: 2022-07-26 | Disposition: A | Discharge status: Home / Self Care | Payer: Medicaid Other | Source: Ambulatory Visit | Attending: Nurse Practitioner | Admitting: Nurse Practitioner

## 2022-07-26 VITALS — BP 116/72 | HR 68 | Temp 98.3°F | Resp 16 | Ht 61.0 in | Wt 165.0 lb

## 2022-07-26 DIAGNOSIS — J0100 Acute maxillary sinusitis, unspecified: Secondary | ICD-10-CM

## 2022-07-26 MED ORDER — AMOXICILLIN-POT CLAVULANATE 875-125 MG PO TABS: 1.0000 | ORAL_TABLET | Freq: Two times a day (BID) | ORAL | 0 refills | Status: AC

## 2022-07-26 MED ORDER — FLUTICASONE PROPIONATE 50 MCG/ACT NA SUSP: 1.0000 | Freq: Every day | NASAL | 0 refills | Status: DC

## 2022-07-26 NOTE — ED Provider Notes (Signed)
[facility]    CSN: [id number] Arrival date & time: 07/26/22  0845      History   Chief Complaint Chief Complaint  Patient presents with   Nasal Congestion    Appt   Cough   Sore Throat   Facial Pressure    HPI Deborah Lopez is a 22 y.o. female  presents for evaluation of URI symptoms for 14 days. Patient reports associated symptoms of sinus pressure/pain with postnasal drip, sore throat, congestion. Denies N/V/D, fevers, cough, ear pain, body aches, shortness of breath. Patient does not have a hx of asthma or smoking.  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  Pt has taken vics nasal spray  OTC for symptoms. Pt has no other concerns at this time.    Cough Sore Throat    Past Medical History:  Diagnosis Date   Anxiety    Asthma    Depression     Patient Active Problem List   Diagnosis Date Noted   Asthma 09/02/2021   Stress 09/02/2021   Pap smear for cervical cancer screening 09/02/2021    History reviewed. No pertinent surgical history.  OB History   No obstetric history on file.      Home Medications    Prior to Admission medications   Medication Sig Start Date End Date Taking? Authorizing Provider  amoxicillin-clavulanate (AUGMENTIN) 875-125 MG tablet Take 1 tablet by mouth every 12 (twelve) hours for 10 days. 07/26/22 08/05/22 Yes Radford Pax, NP  clarithromycin (BIAXIN XL) 500 MG 24 hr tablet Take 2 tablets (1,000 mg total) by mouth daily. 03/18/22  Yes Rodriguez-Southworth, Nettie Elm, PA-C  fluticasone (FLONASE) 50 MCG/ACT nasal spray Place 1 spray into both nostrils daily. 07/26/22  Yes Radford Pax, NP  ipratropium (ATROVENT) 0.06 % nasal spray Place 2 sprays into both nostrils 4 (four) times daily. 04/28/22  Yes Becky Augusta, NP  JUNEL FE 1.5/30 1.5-30 MG-MCG tablet Take 1 tablet by mouth daily. 06/19/20  Yes [provider]  sertraline (ZOLOFT) 50 MG tablet TAKE 1/2 TABLET BY MOUTH DAILY X 10 DAYS AND THEN  ONE EVERY DAY 04/16/22  Yes Dale Grafton, MD  benzonatate (TESSALON) 100 MG capsule Take 2 capsules (200 mg total) by mouth every 8 (eight) hours. 06/22/22   White, Elita Boone, NP  promethazine-dextromethorphan (PROMETHAZINE-DM) 6.25-15 MG/5ML syrup Take 5 mLs by mouth 4 (four) times daily as needed. 06/22/22   Valinda Hoar, NP    Family History Family History  Problem Relation Age of Onset   Arthritis Mother    Depression Mother    Hypertension Mother    Mental illness Mother    Miscarriages / [place] Mother    Alcohol abuse Father    Depression Father    Diabetes Father    Early death Father    Mental illness Father    Asthma Sister    Depression Sister    Anxiety disorder Sister    Miscarriages / [place] Sister    Mental illness Sister    Arthritis Maternal Grandmother    Cancer Maternal Grandfather    Heart attack Maternal Grandfather    Mental illness Paternal Grandmother     Social History Social History   Tobacco Use   Smoking status: Never   Smokeless tobacco: Never  Vaping Use   Vaping Use: Never used  Substance Use Topics   Alcohol use: Yes    Comment: social   Drug use: Never  Allergies   Patient has no known allergies.   Review of Systems Review of Systems  Respiratory:  Positive for cough.      Physical Exam Triage Vital Signs ED Triage Vitals  Enc Vitals Group     BP 07/26/22 0851 116/72     Pulse Rate 07/26/22 0851 68     Resp 07/26/22 0851 16     Temp 07/26/22 0851 98.3 F (36.8 C)     Temp Source 07/26/22 0851 Oral     SpO2 07/26/22 0851 98 %     Weight 07/26/22 0849 165 lb (74.8 kg)     Height 07/26/22 0849  (1.549 m)     Head Circumference --      Peak Flow --      Pain Score 07/26/22 0854 7     Pain Loc --      Pain Edu? --      Excl. in GC? --    No data found.  Updated Vital Signs BP 116/72 (BP Location: Left Arm)   Pulse 68   Temp 98.3 F (36.8 C) (Oral)   Resp 16   Ht  (1.549 m)   Wt  165 lb (74.8 kg)   SpO2 98%   BMI 31.18 kg/m   Visual Acuity Right Eye Distance:   Left Eye Distance:   Bilateral Distance:    Right Eye Near:   Left Eye Near:    Bilateral Near:     Physical Exam   UC Treatments / Results  Labs (all labs ordered are listed, but only abnormal results are displayed) Labs Reviewed - No data to display  EKG   Radiology No results found.  Procedures Procedures (including critical care time)  Medications Ordered in UC Medications - No data to display  Initial Impression / Assessment and Plan / UC Course  I have reviewed the triage vital signs and the nursing notes.  Pertinent labs & imaging results that were available during my care of the patient were reviewed by me and considered in my medical decision making (see chart for details).     Reviewed sx and exam with pt, no red flags.  Start Augmentin for sinusitis given LOS Flonase daily  Nasal rinses as tolerated  PCP follow up if symptoms do not improve ER precautions reviewed and pt verbalized understanding  Final Clinical Impressions(s) / UC Diagnoses   Final diagnoses:  Acute maxillary sinusitis, recurrence not specified     Discharge Instructions      Augmentin twice daily for 10 days Flonase daily Nasal rinses as tolerated Follow-up with your PCP if your symptoms do not improve Please go to the ER for any worsening symptoms    ED Prescriptions     Medication Sig Dispense Auth. Provider   amoxicillin-clavulanate (AUGMENTIN) 875-125 MG tablet Take 1 tablet by mouth every 12 (twelve) hours for 10 days. 20 tablet Radford Pax, NP   fluticasone (FLONASE) 50 MCG/ACT nasal spray Place 1 spray into both nostrils daily. 15.8 mL Radford Pax, NP      PDMP not reviewed this encounter.   Radford Pax, NP 07/26/22 [phone]

## 2022-07-26 NOTE — ED Triage Notes (Signed)
Pt c/o cough,congestion,sore throat & facial pressure x14 days. Pt is a Manufacturing systems engineer.

## 2022-07-26 NOTE — Discharge Instructions (Signed)
Augmentin twice daily for 10 days Flonase daily Nasal rinses as tolerated Follow-up with your PCP if your symptoms do not improve Please go to the ER for any worsening symptoms

## 2022-08-19 NOTE — Progress Notes (Unsigned)
Bethanie Dicker, NP-C Phone: [phone]  Deborah Lopez is a 22 y.o. female who presents today for congestion.   Patient has been treated by Urgent Care twice since March for sinusitis, 06/22/2022 and 07/26/2022. She was treated both times with 10 days of Augmentin. She reports initially her symptoms improve then return approximately 2-3 days later. Denies any ear pain or pressure.   Respiratory illness:  Cough- Yes  Congestion-    Sinus- Yes, nasal and pressure   Chest- No  Post nasal drip- Yes  Sore throat- Yes  Shortness of breath- No  Fever- No  Fatigue/Myalgia- Yes Headache- Yes Nausea/Vomiting- No Taste disturbance- No  Smell disturbance- No  Covid exposure- No  Covid vaccination- x 3  Flu vaccination- UTD  Medications- Mucinex, Zyrtec and Flonase   Social History   Tobacco Use  Smoking Status Never  Smokeless Tobacco Never    Current Outpatient Medications on File Prior to Visit  Medication Sig Dispense Refill   fluticasone (FLONASE) 50 MCG/ACT nasal spray Place 1 spray into both nostrils daily. 15.8 mL 0   No current facility-administered medications on file prior to visit.     ROS see history of present illness  Objective  Physical Exam Vitals:   08/20/22 0817  BP: 110/60  Pulse: 77  Temp: 98.7 F (37.1 C)  SpO2: 97%    BP Readings from Last 3 Encounters:  08/20/22 110/60  07/26/22 116/72  06/22/22 116/83   Wt Readings from Last 3 Encounters:  08/20/22 175 lb (79.4 kg)  07/26/22 165 lb (74.8 kg)  06/22/22 165 lb (74.8 kg)    Physical Exam Constitutional:      General: She is not in acute distress.    Appearance: Normal appearance.  HENT:     Head: Normocephalic.     Right Ear: Tympanic membrane normal.     Left Ear: Tympanic membrane normal.     Nose: Congestion and rhinorrhea present.     Right Turbinates: Swollen.     Right Sinus: Maxillary sinus tenderness present.     Left Sinus: Maxillary sinus tenderness present.      Mouth/Throat:     Mouth: Mucous membranes are moist.     Pharynx: Oropharynx is clear.  Eyes:     Conjunctiva/sclera: Conjunctivae normal.     Pupils: Pupils are equal, round, and reactive to light.  Cardiovascular:     Rate and Rhythm: Normal rate and regular rhythm.     Heart sounds: Normal heart sounds.  Pulmonary:     Effort: Pulmonary effort is normal.     Breath sounds: Normal breath sounds.  Abdominal:     General: Abdomen is flat. Bowel sounds are normal.     Palpations: Abdomen is soft. There is no mass.     Tenderness: There is no abdominal tenderness.  Lymphadenopathy:     Cervical: No cervical adenopathy.  Skin:    General: Skin is warm and dry.  Neurological:     General: No focal deficit present.     Mental Status: She is alert.  Psychiatric:        Mood and Affect: Mood normal.        Behavior: Behavior normal.    Assessment/Plan: Please see individual problem list.  Acute non-recurrent maxillary sinusitis Assessment & Plan: Will treat with Zpak and MDP. Bromfed DM for cough. Counseled on side effects such as drowsiness. She can continue her Flonase daily. Discussed possible referral to ENT if symptoms persist. She will  discuss this with her PCP. Advised adequate fluid intake. Return precautions given to patient.   Orders: -     Azithromycin; Take 2 tablets on day 1, then 1 tablet daily on days 2 through 5  Dispense: 6 tablet; Refill: 0 -     methylPREDNISolone; Take as directed.  Dispense: 21 each; Refill: 0 -     Pseudoeph-Bromphen-DM; Take 5-10 mLs by mouth every 6 (six) hours as needed.  Dispense: 120 mL; Refill: 0    Return if symptoms worsen or fail to improve.   Bethanie Dicker, NP-C [facility] - [employer]

## 2022-08-20 ENCOUNTER — Ambulatory Visit: Payer: Medicaid Other | Admitting: Nurse Practitioner

## 2022-08-20 VITALS — BP 110/60 | HR 77 | Temp 98.7°F | Ht 61.0 in | Wt 175.0 lb

## 2022-08-20 DIAGNOSIS — J0100 Acute maxillary sinusitis, unspecified: Secondary | ICD-10-CM | POA: Diagnosis not present

## 2022-08-20 MED ORDER — METHYLPREDNISOLONE 4 MG PO TBPK: ORAL_TABLET | ORAL | 0 refills | Status: DC

## 2022-08-20 MED ORDER — PSEUDOEPH-BROMPHEN-DM 30-2-10 MG/5ML PO SYRP: 5.0000 mL | ORAL_SOLUTION | Freq: Four times a day (QID) | ORAL | 0 refills | Status: DC | PRN

## 2022-08-20 MED ORDER — AZITHROMYCIN 250 MG PO TABS: ORAL_TABLET | ORAL | 0 refills | Status: AC

## 2022-08-20 NOTE — Assessment & Plan Note (Addendum)
Will treat with Zpak and MDP. Bromfed DM for cough. Counseled on side effects such as drowsiness. She can continue her Flonase daily. Discussed possible referral to ENT if symptoms persist. She will discuss this with her PCP. Advised adequate fluid intake. Return precautions given to patient.

## 2022-08-27 ENCOUNTER — Telehealth: Payer: Self-pay | Admitting: Internal Medicine

## 2022-08-27 NOTE — Telephone Encounter (Signed)
Pt mom called in staying that pt saw Bethanie Dicker on 08/20/22 for a sinus infection. However, she was calling because pt still feeling sick, no improvement, same symptoms, and she was wondering what Dr. Lorin Picket wants her to do? Or if she wants to send over some meds to [facility] on [address]? Any questions, or concern she can be reach on Mychart or a call.

## 2022-09-04 ENCOUNTER — Telehealth (INDEPENDENT_AMBULATORY_CARE_PROVIDER_SITE_OTHER): Payer: 59 | Admitting: Internal Medicine

## 2022-09-04 ENCOUNTER — Encounter: Payer: Self-pay | Admitting: Internal Medicine

## 2022-09-04 VITALS — Ht 61.0 in | Wt 175.0 lb

## 2022-09-04 DIAGNOSIS — J0100 Acute maxillary sinusitis, unspecified: Secondary | ICD-10-CM

## 2022-09-04 DIAGNOSIS — J329 Chronic sinusitis, unspecified: Secondary | ICD-10-CM

## 2022-09-04 DIAGNOSIS — J45909 Unspecified asthma, uncomplicated: Secondary | ICD-10-CM

## 2022-09-04 MED ORDER — AZITHROMYCIN 250 MG PO TABS: ORAL_TABLET | ORAL | 0 refills | Status: AC

## 2022-09-04 MED ORDER — METHYLPREDNISOLONE 4 MG PO TBPK: ORAL_TABLET | ORAL | 0 refills | Status: DC

## 2022-09-04 NOTE — Progress Notes (Deleted)
Subjective:    Patient ID: Deborah Lopez, female    DOB: July 23, 2000, 23 y.o.   MRN: [id number]  Patient here for  Chief Complaint  Patient presents with   Sinus Problem    Discuss ENT referral for recurrent sinus infections    HPI    Past Medical History:  Diagnosis Date   Anxiety    Asthma    Depression    History reviewed. No pertinent surgical history. Family History  Problem Relation Age of Onset   Arthritis Mother    Depression Mother    Hypertension Mother    Mental illness Mother    Miscarriages / [place] Mother    Alcohol abuse Father    Depression Father    Diabetes Father    Early death Father    Mental illness Father    Asthma Sister    Depression Sister    Anxiety disorder Sister    Miscarriages / [place] Sister    Mental illness Sister    Arthritis Maternal Grandmother    Cancer Maternal Grandfather    Heart attack Maternal Grandfather    Mental illness Paternal Grandmother    Social History   Socioeconomic History   Marital status: Significant Other    Spouse name: Not on file   Number of children: Not on file   Years of education: Not on file   Highest education level: Bachelor's degree (e.g., BA, AB, BS)  Occupational History   Not on file  Tobacco Use   Smoking status: Never   Smokeless tobacco: Never  Vaping Use   Vaping Use: Never used  Substance and Sexual Activity   Alcohol use: Yes    Comment: social   Drug use: Never   Sexual activity: Not Currently    Birth control/protection: Pill    Comment: Junel  Other Topics Concern   Not on file  Social History Narrative   Not on file   Social Determinants of Health   Financial Resource Strain: Low Risk  (08/20/2022)   Overall Financial Resource Strain (CARDIA)    Difficulty of Paying Living Expenses: Not hard at all  Food Insecurity: No Food Insecurity (08/20/2022)   Hunger Vital Sign    Worried About Running Out of Food in the Last Year: Never true    Ran Out of Food  in the Last Year: Never true  Transportation Needs: No Transportation Needs (08/20/2022)   PRAPARE - Administrator, Civil Service (Medical): No    Lack of Transportation (Non-Medical): No  Physical Activity: Sufficiently Active (08/20/2022)   Exercise Vital Sign    Days of Exercise per Week: 3 days    Minutes of Exercise per Session: 60 min  Stress: Stress Concern Present (08/20/2022)   [employer] of Occupational Health - Occupational Stress Questionnaire    Feeling of Stress : To some extent  Social Connections: Socially Isolated (08/20/2022)   Social Connection and Isolation Panel [NHANES]    Frequency of Communication with Friends and Family: More than three times a week    Frequency of Social Gatherings with Friends and Family: Three times a week    Attends Religious Services: Never    Active Member of Clubs or Organizations: No    Attends Engineer, structural: Not on file    Marital Status: Never married     Review of Systems     Objective:     Ht 5\' 1"  (1.549 m)   Wt 175 lb (  79.4 kg)   BMI 33.07 kg/m  Wt Readings from Last 3 Encounters:  09/04/22 175 lb (79.4 kg)  08/20/22 175 lb (79.4 kg)  07/26/22 165 lb (74.8 kg)    Physical Exam   Outpatient Encounter Medications as of 09/04/2022  Medication Sig   azithromycin (ZITHROMAX) 250 MG tablet Take 2 tablets on day 1, then 1 tablet daily on days 2 through 5   fluticasone (FLONASE) 50 MCG/ACT nasal spray Place 1 spray into both nostrils daily.   methylPREDNISolone (MEDROL DOSEPAK) 4 MG TBPK tablet Take as directed.   [DISCONTINUED] brompheniramine-pseudoephedrine-DM 30-2-10 MG/5ML syrup Take 5-10 mLs by mouth every 6 (six) hours as needed.   [DISCONTINUED] methylPREDNISolone (MEDROL DOSEPAK) 4 MG TBPK tablet Take as directed.   No facility-administered encounter medications on file as of 09/04/2022.     No results found for: "WBC", "HGB", "HCT", "PLT", "GLUCOSE", "CHOL", "TRIG", "HDL",  "LDLDIRECT", "LDLCALC", "ALT", "AST", "NA", "K", "CL", "CREATININE", "BUN", "CO2", "TSH", "PSA", "INR", "GLUF", "HGBA1C", "MICROALBUR"  No results found.     Assessment & Plan:  Recurrent sinus infections -     Ambulatory referral to ENT  Acute non-recurrent maxillary sinusitis -     methylPREDNISolone; Take as directed.  Dispense: 21 each; Refill: 0  Other orders -     Azithromycin; Take 2 tablets on day 1, then 1 tablet daily on days 2 through 5  Dispense: 6 tablet; Refill: 0     Dale [place], MD

## 2022-09-06 ENCOUNTER — Telehealth: Payer: Self-pay | Admitting: Internal Medicine

## 2022-09-06 ENCOUNTER — Encounter: Payer: Self-pay | Admitting: Internal Medicine

## 2022-09-06 NOTE — Telephone Encounter (Signed)
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  She leaves in 3 weeks.  Can we see if there is anyway ENT can see her before she goes.  Thanks.

## 2022-09-06 NOTE — Progress Notes (Signed)
Patient ID: Deborah Lopez, female   DOB: 05-Feb-2001, 22 y.o.   MRN: [id number]   Virtual Visit via video Note  All issues noted in this document were discussed and addressed.  No physical exam was performed (except for noted visual exam findings with Video Visits).   I connected with Deborah Lopez today by a video enabled telemedicine application and verified that I am speaking with the correct person using two identifiers. Location patient: work Location provider: work Persons participating in the virtual visit: patient, provider  The limitations, risks, security and privacy concerns of performing an evaluation and management service by video and the availability of in person appointments have been discussed.  It has also been discussed with the patient that there may be a patient responsible charge related to this service. The patient expressed understanding and agreed to proceed.   Reason for visit: work in appt  HPI: Work in with request for referral to ENT.  Was seen 03/2022 - diagnosed with sinus infection.  Treated with biaxin and tessalong perles.  Was also seen 04/28/22 - diagnosed with sinus infection.  Treated with augmentin and atrovent nasal spray.  She wasl evaluated in March and April for similar symptoms and treated with augmentin. Most recent - symptoms returned this month - a few weeks ago.  Did receive a zpak.  Feels better, but still with increased nasal congestion and clearing her throat.  Some green mucus production.  No sore throat.  No chest congestion or sob reported.  No vomiting.  Discussed concern regarding recurring symptoms/infections.  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  Leaves in 3 weeks.  Would like to get an ENT appt prior to leaving.    ROS: See pertinent positives and negatives per HPI.  Past Medical History:  Diagnosis Date   Anxiety    Asthma    Depression     History reviewed. No pertinent surgical history.  Family History  Problem  Relation Age of Onset   Arthritis Mother    Depression Mother    Hypertension Mother    Mental illness Mother    Miscarriages / [place] Mother    Alcohol abuse Father    Depression Father    Diabetes Father    Early death Father    Mental illness Father    Asthma Sister    Depression Sister    Anxiety disorder Sister    Miscarriages / [place] Sister    Mental illness Sister    Arthritis Maternal Grandmother    Cancer Maternal Grandfather    Heart attack Maternal Grandfather    Mental illness Paternal Grandmother     SOCIAL HX: reviewed.    Current Outpatient Medications:    azithromycin (ZITHROMAX) 250 MG tablet, Take 2 tablets on day 1, then 1 tablet daily on days 2 through 5, Disp: 6 tablet, Rfl: 0   fluticasone (FLONASE) 50 MCG/ACT nasal spray, Place 1 spray into both nostrils daily., Disp: 15.8 mL, Rfl: 0   methylPREDNISolone (MEDROL DOSEPAK) 4 MG TBPK tablet, Take as directed., Disp: 21 each, Rfl: 0  EXAM:  GENERAL: alert, oriented, appears well and in no acute distress  HEENT: atraumatic, conjunttiva clear, no obvious abnormalities on inspection of external nose and ears  NECK: normal movements of the head and neck  LUNGS: on inspection no signs of respiratory distress, breathing rate appears normal, no obvious gross SOB, gasping or wheezing  CV: no obvious cyanosis  PSYCH/NEURO: pleasant and cooperative, no  obvious depression or anxiety, speech and thought processing grossly intact  ASSESSMENT AND PLAN:  Discussed the following assessment and plan:  Problem List Items Addressed This Visit     Recurrent sinus infections - Primary    With current symptoms now as outlined.  Will treat acute infection.  Felt she responded well to azithromycin, but her symptoms did not resolve.  Has improved.  Will prescribed zpak as directed.  Medrol dosepak as directed.  Saline nasal spray/steroid nasal spray.  Recommend antihistamine - to continue once infection  cleared.  Refer to ENT given persistent recurring symptoms/infections.        Relevant Medications   methylPREDNISolone (MEDROL DOSEPAK) 4 MG TBPK tablet   azithromycin (ZITHROMAX) 250 MG tablet   Other Relevant Orders   Ambulatory referral to ENT   Asthma    Diagnosed as a child.  No problems with breathing.  Follow.       Relevant Medications   methylPREDNISolone (MEDROL DOSEPAK) 4 MG TBPK tablet   Acute non-recurrent maxillary sinusitis   Relevant Medications   methylPREDNISolone (MEDROL DOSEPAK) 4 MG TBPK tablet   azithromycin (ZITHROMAX) 250 MG tablet    Return if symptoms worsen or fail to improve.   I discussed the assessment and treatment plan with the patient. The patient was provided an opportunity to ask questions and all were answered. The patient agreed with the plan and demonstrated an understanding of the instructions.   The patient was advised to call back or seek an in-person evaluation if the symptoms worsen or if the condition fails to improve as anticipated.    Dale , MD

## 2022-09-06 NOTE — Assessment & Plan Note (Signed)
Diagnosed as a child.  No problems with breathing.  Follow.

## 2022-09-06 NOTE — Assessment & Plan Note (Signed)
With current symptoms now as outlined.  Will treat acute infection.  Felt she responded well to azithromycin, but her symptoms did not resolve.  Has improved.  Will prescribed zpak as directed.  Medrol dosepak as directed.  Saline nasal spray/steroid nasal spray.  Recommend antihistamine - to continue once infection cleared.  Refer to ENT given persistent recurring symptoms/infections.

## 2022-09-11 NOTE — Telephone Encounter (Signed)
Thank you :)

## 2022-10-10 ENCOUNTER — Telehealth (INDEPENDENT_AMBULATORY_CARE_PROVIDER_SITE_OTHER): Payer: 59 | Admitting: Internal Medicine

## 2022-10-10 ENCOUNTER — Encounter: Payer: Self-pay | Admitting: Internal Medicine

## 2022-10-10 VITALS — Ht 61.0 in | Wt 175.0 lb

## 2022-10-10 DIAGNOSIS — J329 Chronic sinusitis, unspecified: Secondary | ICD-10-CM

## 2022-10-10 DIAGNOSIS — F439 Reaction to severe stress, unspecified: Secondary | ICD-10-CM

## 2022-10-10 DIAGNOSIS — J069 Acute upper respiratory infection, unspecified: Secondary | ICD-10-CM | POA: Diagnosis not present

## 2022-10-10 MED ORDER — METHYLPREDNISOLONE 4 MG PO TBPK: ORAL_TABLET | ORAL | 0 refills | Status: DC

## 2022-10-10 NOTE — Progress Notes (Unsigned)
Patient ID: Deborah Lopez, female   DOB: Jul 03, 2000, 22 y.o.   MRN: [id number]   Virtual Visit via video Note  I connected with Danton Clap by a video enabled telemedicine application and verified that I am speaking with the correct person using two identifiers. Location patient: home Location provider: work  Persons participating in the virtual visit: patient, provider  the limitations, risks, security and privacy concerns of performing an evaluation and management service by video and the availability of in person appointments have been discussed.  It has also been discussed with the patient that there may be a patient responsible charge related to this service. The patient expressed understanding and agreed to proceed.   Reason for visit: work in appt  HPI: Work in to discussed nasal congestion, drainage and cough.  Was in [place].  Just returned to day.  Reports that noticed symptoms starting 10/05/22.  Noted sore throat.  Some nasal congestion.  Productive nasal mucus.  No sore throat now.  Cough.  Dry cough. No wheezing.  No increased sob.  Some coughing fits.  No vomiting or diarrhea reported.     ROS: See pertinent positives and negatives per HPI.  Past Medical History:  Diagnosis Date   Anxiety    Asthma    Depression     History reviewed. No pertinent surgical history.  Family History  Problem Relation Age of Onset   Arthritis Mother    Depression Mother    Hypertension Mother    Mental illness Mother    Miscarriages / [place] Mother    Alcohol abuse Father    Depression Father    Diabetes Father    Early death Father    Mental illness Father    Asthma Sister    Depression Sister    Anxiety disorder Sister    Miscarriages / [place] Sister    Mental illness Sister    Arthritis Maternal Grandmother    Cancer Maternal Grandfather    Heart attack Maternal Grandfather    Mental illness Paternal Grandmother     SOCIAL HX: reviewed.    Current Outpatient  Medications:    methylPREDNISolone (MEDROL DOSEPAK) 4 MG TBPK tablet, Medrol dose pak - 6 day taper, Disp: 21 tablet, Rfl: 0  EXAM:  GENERAL: alert, oriented, appears well and in no acute distress  HEENT: atraumatic, conjunttiva clear, no obvious abnormalities on inspection of external nose and ears  NECK: normal movements of the head and neck  LUNGS: on inspection no signs of respiratory distress, breathing rate appears normal, no obvious gross SOB, gasping or wheezing  CV: no obvious cyanosis  PSYCH/NEURO: pleasant and cooperative, no obvious depression or anxiety, speech and thought processing grossly intact  ASSESSMENT AND PLAN:  Discussed the following assessment and plan:  Problem List Items Addressed This Visit     URI (upper respiratory infection) - Primary    Nasal congestion with cough.  Discussed.  Treat with saline nasal spray and steroid nasal spray as directed.  Robitussin DM.  Medrol dose pak.  Hold abx.  Follow.  Call with update.       Stress    Schedule f/u appt.  Overall stable.       Recurrent sinus infections    Have placed referral to ENT for recurring infections.  Follow.       Relevant Medications   methylPREDNISolone (MEDROL DOSEPAK) 4 MG TBPK tablet    Return in about 8 weeks (around 12/05/2022) for physical.   I  discussed the assessment and treatment plan with the patient. The patient was provided an opportunity to ask questions and all were answered. The patient agreed with the plan and demonstrated an understanding of the instructions.   The patient was advised to call back or seek an in-person evaluation if the symptoms worsen or if the condition fails to improve as anticipated.    Dale Hanover, MD

## 2022-10-11 ENCOUNTER — Encounter: Payer: Self-pay | Admitting: Internal Medicine

## 2022-10-11 DIAGNOSIS — J069 Acute upper respiratory infection, unspecified: Secondary | ICD-10-CM | POA: Insufficient documentation

## 2022-10-11 NOTE — Assessment & Plan Note (Signed)
Schedule f/u appt.  Overall stable.

## 2022-10-11 NOTE — Assessment & Plan Note (Signed)
Have placed referral to ENT for recurring infections.  Follow.

## 2022-10-11 NOTE — Assessment & Plan Note (Signed)
Nasal congestion with cough.  Discussed.  Treat with saline nasal spray and steroid nasal spray as directed.  Robitussin DM.  Medrol dose pak.  Hold abx.  Follow.  Call with update.

## 2022-10-13 ENCOUNTER — Telehealth: Payer: Self-pay | Admitting: Internal Medicine

## 2022-10-13 NOTE — Telephone Encounter (Signed)
LVMTCB to make cpe in 8 weeks

## 2022-11-07 ENCOUNTER — Other Ambulatory Visit: Payer: Self-pay | Admitting: Internal Medicine

## 2022-11-09 ENCOUNTER — Ambulatory Visit
Admission: RE | Admit: 2022-11-09 | Discharge: 2022-11-09 | Disposition: A | Discharge status: Home / Self Care | Payer: 59 | Source: Ambulatory Visit | Attending: Physician Assistant | Admitting: Physician Assistant

## 2022-11-09 VITALS — BP 123/84 | HR 121 | Temp 98.5°F | Ht 61.0 in | Wt 160.0 lb

## 2022-11-09 DIAGNOSIS — J019 Acute sinusitis, unspecified: Secondary | ICD-10-CM

## 2022-11-09 DIAGNOSIS — R0981 Nasal congestion: Secondary | ICD-10-CM | POA: Diagnosis not present

## 2022-11-09 MED ORDER — AMOXICILLIN-POT CLAVULANATE 875-125 MG PO TABS: 1.0000 | ORAL_TABLET | Freq: Two times a day (BID) | ORAL | 0 refills | Status: AC

## 2022-11-09 NOTE — ED Provider Notes (Signed)
[facility]    CSN: [id number] Arrival date & time: 11/09/22  0940      History   Chief Complaint Chief Complaint  Patient presents with   Nasal Congestion    HPI Deborah Lopez is a 22 y.o. female presenting for 2-week history of facial pain and nasal congestion.  Also reports productive cough. Patient reports initially had nasal drainage that was clear but over the past couple days it has turned to a yellowish-green color and she has had more intense pain and discomfort behind her eyes and of the maxillary sinus region.  Denies fever, fatigue, chest pain, breathing difficulty.  Reports taking Zyrtec and using Flonase but no improvement in symptoms.  History of sinus infections.  Reports she gets about 3 of them each year. Has not seen ENT.  Her last one was about 2 months ago.  History of asthma.  She has no other complaints.  HPI  Past Medical History:  Diagnosis Date   Anxiety    Asthma    Depression     Patient Active Problem List   Diagnosis Date Noted   URI (upper respiratory infection) 10/11/2022   Recurrent sinus infections 09/04/2022   Acute non-recurrent maxillary sinusitis 08/20/2022   Asthma 09/02/2021   Stress 09/02/2021   Pap smear for cervical cancer screening 09/02/2021    History reviewed. No pertinent surgical history.  OB History   No obstetric history on file.      Home Medications    Prior to Admission medications   Medication Sig Start Date End Date Taking? Authorizing Provider  amoxicillin-clavulanate (AUGMENTIN) 875-125 MG tablet Take 1 tablet by mouth every 12 (twelve) hours for 7 days. 11/09/22 11/16/22 Yes Shirlee Latch, PA-C  methylPREDNISolone (MEDROL DOSEPAK) 4 MG TBPK tablet Medrol dose pak - 6 day taper 10/10/22   Dale Waverly, MD    Family History Family History  Problem Relation Age of Onset   Arthritis Mother    Depression Mother    Hypertension Mother    Mental illness Mother    Miscarriages / [place]  Mother    Alcohol abuse Father    Depression Father    Diabetes Father    Early death Father    Mental illness Father    Asthma Sister    Depression Sister    Anxiety disorder Sister    Miscarriages / [place] Sister    Mental illness Sister    Arthritis Maternal Grandmother    Cancer Maternal Grandfather    Heart attack Maternal Grandfather    Mental illness Paternal Grandmother     Social History Social History   Tobacco Use   Smoking status: Never   Smokeless tobacco: Never  Vaping Use   Vaping status: Never Used  Substance Use Topics   Alcohol use: Yes    Comment: social   Drug use: Never     Allergies   Patient has no known allergies.   Review of Systems Review of Systems  Constitutional:  Negative for chills, diaphoresis, fatigue and fever.  HENT:  Positive for congestion, rhinorrhea, sinus pressure, sinus pain and sore throat. Negative for ear pain.   Respiratory:  Positive for cough. Negative for shortness of breath.   Cardiovascular:  Negative for chest pain.  Gastrointestinal:  Negative for abdominal pain, nausea and vomiting.  Musculoskeletal:  Negative for arthralgias and myalgias.  Skin:  Negative for rash.  Neurological:  Negative for weakness and headaches.  Hematological:  Negative for adenopathy.  Physical Exam Triage Vital Signs ED Triage Vitals  Enc Vitals Group     BP      Pulse      Resp      Temp      Temp src      SpO2      Weight      Height      Head Circumference      Peak Flow      Pain Score      Pain Loc      Pain Edu?      Excl. in GC?    No data found.  Updated Vital Signs BP 123/84 (BP Location: Left Arm)   Pulse (!) 121   Temp 98.5 F (36.9 C) (Oral)   Ht 5\' 1"  (1.549 m)   Wt 160 lb (72.6 kg)   LMP 11/09/2022   SpO2 94%   BMI 30.23 kg/m   Physical Exam Vitals and nursing note reviewed.  Constitutional:      General: She is not in acute distress.    Appearance: Normal appearance. She is not  ill-appearing or toxic-appearing.  HENT:     Head: Normocephalic and atraumatic.     Right Ear: Tympanic membrane, ear canal and external ear normal.     Left Ear: Tympanic membrane, ear canal and external ear normal.     Nose: Congestion present.     Mouth/Throat:     Mouth: Mucous membranes are moist.     Pharynx: Oropharynx is clear. Posterior oropharyngeal erythema (mild with PND) present.  Eyes:     General: No scleral icterus.       Right eye: No discharge.        Left eye: No discharge.     Conjunctiva/sclera: Conjunctivae normal.  Cardiovascular:     Rate and Rhythm: Regular rhythm. Tachycardia present.     Heart sounds: Normal heart sounds.  Pulmonary:     Effort: Pulmonary effort is normal. No respiratory distress.     Breath sounds: Normal breath sounds.  Musculoskeletal:     Cervical back: Neck supple.  Skin:    General: Skin is dry.  Neurological:     General: No focal deficit present.     Mental Status: She is alert. Mental status is at baseline.     Motor: No weakness.     Gait: Gait normal.  Psychiatric:        Mood and Affect: Mood normal.        Behavior: Behavior normal.        Thought Content: Thought content normal.      UC Treatments / Results  Labs (all labs ordered are listed, but only abnormal results are displayed) Labs Reviewed - No data to display  EKG   Radiology No results found.  Procedures Procedures (including critical care time)  Medications Ordered in UC Medications - No data to display  Initial Impression / Assessment and Plan / UC Course  I have reviewed the triage vital signs and the nursing notes.  Pertinent labs & imaging results that were available during my care of the patient were reviewed by me and considered in my medical decision making (see chart for details).  22 year old female presenting for nasal congestion with yellowish-green drainage and facial pain.  Initial symptom onset was about 2 weeks ago but  symptoms have worsened over the past couple of days.  She is afebrile and overall well-appearing.  Nasal congestion on exam as well  as erythema posterior pharynx with postnasal drainage.  Chest clear to auscultation heart regular rhythm.  Symptoms are consistent with sinusitis.  She has history of recurrent sinusitis and has been seen her multiple times here for antibiotics. Has history of asthma and allergies. Many of her symptoms likely due to allergies . At this time, we will send Augmentin to pharmacy.  Advised her to switch to Zyrtec-D and continue using Flonase.  Advised following up with PCP as does need ENT referral given that she has 3+ sinus infections each year.   Final Clinical Impressions(s) / UC Diagnoses   Final diagnoses:  Acute sinusitis, recurrence not specified, unspecified location  Nasal congestion   Discharge Instructions   None     ED Prescriptions     Medication Sig Dispense Auth. Provider   amoxicillin-clavulanate (AUGMENTIN) 875-125 MG tablet Take 1 tablet by mouth every 12 (twelve) hours for 7 days. 14 tablet Gareth Leslye      PDMP not reviewed this encounter.      Shirlee Latch, PA-C 11/09/22 1039

## 2022-11-09 NOTE — ED Triage Notes (Signed)
Pt is a preschool teacher  Pt c/o productive cough, nasal congstion x1-2weeks  Pt states that she is having thick, green mucous.   Pt states that her face has been constantly sore.

## 2022-11-11 ENCOUNTER — Ambulatory Visit: Payer: 59

## 2022-11-12 ENCOUNTER — Telehealth: Payer: Self-pay

## 2022-11-12 ENCOUNTER — Ambulatory Visit
Admission: RE | Admit: 2022-11-12 | Discharge: 2022-11-12 | Disposition: A | Discharge status: Home / Self Care | Payer: 59 | Source: Ambulatory Visit

## 2022-11-12 VITALS — BP 119/85 | HR 89 | Temp 98.6°F | Resp 16

## 2022-11-12 DIAGNOSIS — W57XXXA Bitten or stung by nonvenomous insect and other nonvenomous arthropods, initial encounter: Secondary | ICD-10-CM

## 2022-11-12 DIAGNOSIS — S80861A Insect bite (nonvenomous), right lower leg, initial encounter: Secondary | ICD-10-CM

## 2022-11-12 MED ORDER — TRIAMCINOLONE ACETONIDE 0.025 % EX OINT: 1.0000 | TOPICAL_OINTMENT | Freq: Two times a day (BID) | CUTANEOUS | 0 refills | Status: DC

## 2022-11-12 MED ORDER — PREDNISONE 20 MG PO TABS: 20.0000 mg | ORAL_TABLET | Freq: Every day | ORAL | 0 refills | Status: DC

## 2022-11-12 MED ORDER — DOXYCYCLINE HYCLATE 100 MG PO CAPS: 100.0000 mg | ORAL_CAPSULE | Freq: Two times a day (BID) | ORAL | 0 refills | Status: DC

## 2022-11-12 NOTE — Telephone Encounter (Signed)
Called [facility] in [place] to cancel pt's triamcinolone (KENALOG) 0.025 % ointment that was sent in error by [facility].

## 2022-11-12 NOTE — ED Triage Notes (Signed)
Pt presents with an ?insect bite to the back of her right leg x 5 days.

## 2022-11-12 NOTE — ED Provider Notes (Signed)
[facility]    CSN: [id number] Arrival date & time: 11/12/22  1638      History   Chief Complaint Chief Complaint  Patient presents with   Insect Bite    HPI Deborah Lopez is a 22 y.o. female who presents with a possible insect bite on the back on her R leg x 5 days. It looked like when she gets a local reaction from a mosquito intimally, but yesterday the redness got larger and started hurting. She is going out of town, and wanted to be checked before she left. She applied antibiotic ointment last night and took a Benadryl and the redness is not as bad. She is on Augmentin for sinusitis.     Past Medical History:  Diagnosis Date   Anxiety    Asthma    Depression     Patient Active Problem List   Diagnosis Date Noted   URI (upper respiratory infection) 10/11/2022   Recurrent sinus infections 09/04/2022   Acute non-recurrent maxillary sinusitis 08/20/2022   Asthma 09/02/2021   Stress 09/02/2021   Pap smear for cervical cancer screening 09/02/2021    History reviewed. No pertinent surgical history.  OB History   No obstetric history on file.      Home Medications    Prior to Admission medications   Medication Sig Start Date End Date Taking? Authorizing Provider  doxycycline (VIBRAMYCIN) 100 MG capsule Take 1 capsule (100 mg total) by mouth 2 (two) times daily. 11/12/22  Yes Rodriguez-Southworth, Nettie Elm, PA-C  predniSONE (DELTASONE) 20 MG tablet Take 1 tablet (20 mg total) by mouth daily with breakfast. 11/12/22  Yes Rodriguez-Southworth, Nettie Elm, PA-C  amoxicillin-clavulanate (AUGMENTIN) 875-125 MG tablet Take 1 tablet by mouth every 12 (twelve) hours for 7 days. 11/09/22 11/16/22  Shirlee Latch, PA-C    Family History Family History  Problem Relation Age of Onset   Arthritis Mother    Depression Mother    Hypertension Mother    Mental illness Mother    Miscarriages / [place] Mother    Alcohol abuse Father    Depression Father    Diabetes Father     Early death Father    Mental illness Father    Asthma Sister    Depression Sister    Anxiety disorder Sister    Miscarriages / [place] Sister    Mental illness Sister    Arthritis Maternal Grandmother    Cancer Maternal Grandfather    Heart attack Maternal Grandfather    Mental illness Paternal Grandmother     Social History Social History   Tobacco Use   Smoking status: Never   Smokeless tobacco: Never  Vaping Use   Vaping status: Never Used  Substance Use Topics   Alcohol use: Yes    Comment: social   Drug use: Never     Allergies   Patient has no known allergies.   Review of Systems Review of Systems  As noted in HPI Physical Exam Triage Vital Signs ED Triage Vitals  Encounter Vitals Group     BP 11/12/22 1647 119/85     Systolic BP Percentile --      Diastolic BP Percentile --      Pulse Rate 11/12/22 1647 89     Resp 11/12/22 1647 16     Temp 11/12/22 1647 98.6 F (37 C)     Temp Source 11/12/22 1647 Oral     SpO2 11/12/22 1647 99 %     Weight --  Height --      Head Circumference --      Peak Flow --      Pain Score 11/12/22 1646 0     Pain Loc --      Pain Education --      Exclude from Growth Chart --    No data found.  Updated Vital Signs BP 119/85 (BP Location: Left Arm)   Pulse 89   Temp 98.6 F (37 C) (Oral)   Resp 16   LMP 11/09/2022   SpO2 99%   Visual Acuity Right Eye Distance:   Left Eye Distance:   Bilateral Distance:    Right Eye Near:   Left Eye Near:    Bilateral Near:     Physical Exam Vitals and nursing note reviewed.  Constitutional:      General: She is not in acute distress.    Appearance: She is obese.  HENT:     Right Ear: External ear normal.     Left Ear: External ear normal.  Eyes:     General: No scleral icterus.    Conjunctiva/sclera: Conjunctivae normal.  Pulmonary:     Effort: Pulmonary effort is normal.  Musculoskeletal:     Cervical back: Neck supple.  Skin:    General: Skin is  warm and dry.     Comments: R THIGH- distal inner area with 3 mm x 3 mm scab where the insect bite was, and has mild redness about 2 inches outwards of this all around. There  is minimal warmth, and no streaking noted.   Neurological:     Mental Status: She is alert and oriented to person, place, and time.  Psychiatric:        Mood and Affect: Mood normal.        Behavior: Behavior normal.        Thought Content: Thought content normal.        Judgment: Judgment normal.      UC Treatments / Results  Labs (all labs ordered are listed, but only abnormal results are displayed) Labs Reviewed - No data to display  EKG   Radiology No results found.  Procedures Procedures (including critical care time)  Medications Ordered in UC Medications - No data to display  Initial Impression / Assessment and Plan / UC Course  I have reviewed the triage vital signs and the nursing notes.  Local reaction of insect bite with infection  I placed her on Doxy and prednisone as noted and she will d/c the Augmentin.  Final Clinical Impressions(s) / UC Diagnoses   Final diagnoses:  Bug bite with infection, initial encounter   Discharge Instructions   None    ED Prescriptions     Medication Sig Dispense Auth. Provider   triamcinolone (KENALOG) 0.025 % ointment  (Status: Discontinued) Apply 1 Application topically 2 (two) times daily. For 7 days 30 g Rodriguez-Southworth, Nettie Elm, PA-C   predniSONE (DELTASONE) 20 MG tablet Take 1 tablet (20 mg total) by mouth daily with breakfast. 5 tablet Rodriguez-Southworth, Nettie Elm, PA-C   doxycycline (VIBRAMYCIN) 100 MG capsule Take 1 capsule (100 mg total) by mouth 2 (two) times daily. 14 capsule Rodriguez-Southworth, Nettie Elm, PA-C      PDMP not reviewed this encounter.   Garey Ham, [place] 11/12/22 1717

## 2022-11-14 ENCOUNTER — Encounter: Payer: 59 | Admitting: Internal Medicine

## 2022-11-20 ENCOUNTER — Encounter (INDEPENDENT_AMBULATORY_CARE_PROVIDER_SITE_OTHER): Payer: Self-pay

## 2022-11-28 DIAGNOSIS — Z3041 Encounter for surveillance of contraceptive pills: Secondary | ICD-10-CM | POA: Diagnosis not present

## 2022-12-31 ENCOUNTER — Ambulatory Visit (INDEPENDENT_AMBULATORY_CARE_PROVIDER_SITE_OTHER): Payer: 59 | Admitting: Internal Medicine

## 2022-12-31 ENCOUNTER — Encounter: Payer: Self-pay | Admitting: Internal Medicine

## 2022-12-31 VITALS — BP 118/68 | HR 96 | Temp 97.8°F | Resp 16 | Ht 61.0 in | Wt 179.0 lb

## 2022-12-31 DIAGNOSIS — Z789 Other specified health status: Secondary | ICD-10-CM

## 2022-12-31 DIAGNOSIS — F439 Reaction to severe stress, unspecified: Secondary | ICD-10-CM

## 2022-12-31 DIAGNOSIS — Z13220 Encounter for screening for lipoid disorders: Secondary | ICD-10-CM

## 2022-12-31 DIAGNOSIS — Z9109 Other allergy status, other than to drugs and biological substances: Secondary | ICD-10-CM | POA: Insufficient documentation

## 2022-12-31 LAB — LIPID PANEL
Cholesterol: 137 mg/dL (ref 0–200)
HDL: 50 mg/dL (ref >39.00)
LDL Cholesterol: 64 mg/dL (ref 0–99)
NonHDL: 87.05
Total CHOL/HDL Ratio: 3
Triglycerides: 117 mg/dL (ref 0.0–149.0)
VLDL: 23.4 mg/dL (ref 0.0–40.0)

## 2022-12-31 LAB — COMPREHENSIVE METABOLIC PANEL
ALT: 21 U/L (ref 0–35)
AST: 17 U/L (ref 0–37)
Albumin: 4.1 g/dL (ref 3.5–5.2)
Alkaline Phosphatase: 43 U/L (ref 39–117)
BUN: 9 mg/dL (ref 6–23)
CO2: 25 mEq/L (ref 19–32)
Calcium: 9.5 mg/dL (ref 8.4–10.5)
Chloride: 105 mEq/L (ref 96–112)
Creatinine, Ser: 0.8 mg/dL (ref 0.40–1.20)
GFR: 104.04 mL/min (ref >60.00)
Glucose, Bld: 81 mg/dL (ref 70–99)
Potassium: 3.9 mEq/L (ref 3.5–5.1)
Sodium: 139 mEq/L (ref 135–145)
Total Bilirubin: 0.5 mg/dL (ref 0.2–1.2)
Total Protein: 7 g/dL (ref 6.0–8.3)

## 2022-12-31 LAB — CBC WITH DIFFERENTIAL/PLATELET
Basophils Absolute: 0.1 10*3/uL (ref 0.0–0.1)
Basophils Relative: 0.7 % (ref 0.0–3.0)
Eosinophils Absolute: 0.2 10*3/uL (ref 0.0–0.7)
Eosinophils Relative: 1.9 % (ref 0.0–5.0)
HCT: 41.5 % (ref 36.0–46.0)
Hemoglobin: 13.9 g/dL (ref 12.0–15.0)
Lymphocytes Relative: 25.5 % (ref 12.0–46.0)
Lymphs Abs: 2.2 10*3/uL (ref 0.7–4.0)
MCHC: 33.5 g/dL (ref 30.0–36.0)
MCV: 87.6 fl (ref 78.0–100.0)
Monocytes Absolute: 0.9 10*3/uL (ref 0.1–1.0)
Monocytes Relative: 10.1 % (ref 3.0–12.0)
Neutro Abs: 5.3 10*3/uL (ref 1.4–7.7)
Neutrophils Relative %: 61.8 % (ref 43.0–77.0)
Platelets: 377 10*3/uL (ref 150.0–400.0)
RBC: 4.74 Mil/uL (ref 3.87–5.11)
RDW: 13.1 % (ref 11.5–15.5)
WBC: 8.7 10*3/uL (ref 4.0–10.5)

## 2022-12-31 LAB — TSH: TSH: 1.08 u[IU]/mL (ref 0.35–5.50)

## 2022-12-31 MED ORDER — SERTRALINE HCL 50 MG PO TABS: ORAL_TABLET | ORAL | 2 refills | Status: DC

## 2022-12-31 NOTE — Assessment & Plan Note (Signed)
Restarted birth control through gyn.

## 2022-12-31 NOTE — Progress Notes (Signed)
Subjective:    Patient ID: Deborah Lopez, female    DOB: 04/15/2000, 22 y.o.   MRN: [id number]  Patient here for work in appt.   HPI Here to discuss her anxiety. Saw gyn (Dr Jean Rosenthal) 11/28/22. Previously on Junel.  Off since [place] while [place] of country. Has restarted ocp's.  Some spotting.  Just started back.  Body getting adjusted.  Some increased cramping.  Some allergy symptoms.  Takes zyrtec prn.  This controls.  Breathing stable.  No urine change or bowel change reported. Does report increased stress.  Stress at work.  Increased anxiety.  Previously on zoloft.  Feels needs to be back on medication to help level things out.  No significant depression.  No SI.    Past Medical History:  Diagnosis Date   Anxiety    Asthma    Depression    History reviewed. No pertinent surgical history. Family History  Problem Relation Age of Onset   Arthritis Mother    Depression Mother    Hypertension Mother    Mental illness Mother    Miscarriages / [place] Mother    Alcohol abuse Father    Depression Father    Diabetes Father    Early death Father    Mental illness Father    Asthma Sister    Depression Sister    Anxiety disorder Sister    Miscarriages / [place] Sister    Mental illness Sister    Arthritis Maternal Grandmother    Cancer Maternal Grandfather    Heart attack Maternal Grandfather    Mental illness Paternal Grandmother    Social History   Socioeconomic History   Marital status: Significant Other    Spouse name: Not on file   Number of children: Not on file   Years of education: Not on file   Highest education level: Bachelor's degree (e.g., BA, AB, BS)  Occupational History   Not on file  Tobacco Use   Smoking status: Never   Smokeless tobacco: Never  Vaping Use   Vaping status: Never Used  Substance and Sexual Activity   Alcohol use: Yes    Comment: social   Drug use: Never   Sexual activity: Not Currently    Comment: Junel  Other Topics Concern    Not on file  Social History Narrative   Not on file   Social Determinants of Health   Financial Resource Strain: Low Risk  (08/20/2022)   Overall Financial Resource Strain (CARDIA)    Difficulty of Paying Living Expenses: Not hard at all  Food Insecurity: No Food Insecurity (08/20/2022)   Hunger Vital Sign    Worried About Running Out of Food in the Last Year: Never true    Ran Out of Food in the Last Year: Never true  Transportation Needs: No Transportation Needs (08/20/2022)   PRAPARE - Administrator, Civil Service (Medical): No    Lack of Transportation (Non-Medical): No  Physical Activity: Sufficiently Active (08/20/2022)   Exercise Vital Sign    Days of Exercise per Week: 3 days    Minutes of Exercise per Session: 60 min  Stress: Stress Concern Present (08/20/2022)   [employer] of Occupational Health - Occupational Stress Questionnaire    Feeling of Stress : To some extent  Social Connections: Socially Isolated (08/20/2022)   Social Connection and Isolation Panel [NHANES]    Frequency of Communication with Friends and Family: More than three times a week    Frequency of  Social Gatherings with Friends and Family: Three times a week    Attends Religious Services: Never    Active Member of Clubs or Organizations: No    Attends Engineer, structural: Not on file    Marital Status: Never married     Review of Systems  Constitutional:  Negative for appetite change and unexpected weight change.  HENT:  Negative for sore throat.        Some allergy symptoms reported.   Respiratory:  Negative for cough, chest tightness and shortness of breath.   Cardiovascular:  Negative for chest pain and palpitations.  Gastrointestinal:  Negative for abdominal pain, diarrhea, nausea and vomiting.  Genitourinary:  Negative for difficulty urinating and dysuria.  Musculoskeletal:  Negative for joint swelling and myalgias.  Skin:  Negative for color change and rash.   Neurological:  Negative for dizziness and headaches.  Psychiatric/Behavioral:  Negative for agitation and dysphoric mood.        Objective:     BP 118/68   Pulse 96   Temp 97.8 F (36.6 C)   Resp 16   Ht 5\' 1"  (1.549 m)   Wt 179 lb (81.2 kg)   SpO2 98%   BMI 33.82 kg/m  Wt Readings from Last 3 Encounters:  12/31/22 179 lb (81.2 kg)  11/09/22 160 lb (72.6 kg)  10/10/22 175 lb (79.4 kg)    Physical Exam Vitals reviewed.  Constitutional:      General: She is not in acute distress.    Appearance: Normal appearance.  HENT:     Head: Normocephalic and atraumatic.     Right Ear: External ear normal.     Left Ear: External ear normal.  Eyes:     General: No scleral icterus.       Right eye: No discharge.        Left eye: No discharge.     Conjunctiva/sclera: Conjunctivae normal.  Neck:     Thyroid: No thyromegaly.  Cardiovascular:     Rate and Rhythm: Normal rate and regular rhythm.  Pulmonary:     Effort: No respiratory distress.     Breath sounds: Normal breath sounds. No wheezing.  Abdominal:     General: Bowel sounds are normal.     Palpations: Abdomen is soft.     Tenderness: There is no abdominal tenderness.  Musculoskeletal:        General: No swelling or tenderness.     Cervical back: Neck supple. No tenderness.  Lymphadenopathy:     Cervical: No cervical adenopathy.  Skin:    Findings: No erythema or rash.  Neurological:     Mental Status: She is alert.  Psychiatric:        Mood and Affect: Mood normal.        Behavior: Behavior normal.      Outpatient Encounter Medications as of 12/31/2022  Medication Sig   AUROVELA 1/20 1-20 MG-MCG tablet Take 1 tablet by mouth daily.   sertraline (ZOLOFT) 50 MG tablet Take 1/2 tablet q day x 1 week and then one po q day.   [DISCONTINUED] doxycycline (VIBRAMYCIN) 100 MG capsule Take 1 capsule (100 mg total) by mouth 2 (two) times daily.   [DISCONTINUED] predniSONE (DELTASONE) 20 MG tablet Take 1 tablet (20 mg  total) by mouth daily with breakfast.   No facility-administered encounter medications on file as of 12/31/2022.       Assessment & Plan:  Stress Assessment & Plan: Increased stress and anxiety as  outlined.  Discussed.  Previously did well on zoloft.  Will restart.  Start with 50mg  1/2 tablet q day x 1 week and then increase to 50mg  q day.  Follow closely.  Schedule f/u soon to reassess.    Orders: -     CBC with Differential/Platelet -     Comprehensive metabolic panel -     TSH  Screening cholesterol level -     Lipid panel  Environmental allergies Assessment & Plan: Currently controlled with zyrtec.  Follow.     Uses birth control Assessment & Plan: Restarted birth control through gyn.     Other orders -     Sertraline HCl; Take 1/2 tablet q day x 1 week and then one po q day.  Dispense: 30 tablet; Refill: 2     Dale [place], MD

## 2022-12-31 NOTE — Assessment & Plan Note (Signed)
Increased stress and anxiety as outlined.  Discussed.  Previously did well on zoloft.  Will restart.  Start with 50mg  1/2 tablet q day x 1 week and then increase to 50mg  q day.  Follow closely.  Schedule f/u soon to reassess.

## 2022-12-31 NOTE — Assessment & Plan Note (Signed)
Currently controlled with zyrtec.  Follow.

## 2023-02-11 ENCOUNTER — Ambulatory Visit
Admission: EM | Admit: 2023-02-11 | Discharge: 2023-02-11 | Disposition: A | Discharge status: Home / Self Care | Payer: 59 | Attending: Family Medicine | Admitting: Family Medicine

## 2023-02-11 ENCOUNTER — Encounter: Payer: Self-pay | Admitting: Family Medicine

## 2023-02-11 DIAGNOSIS — J40 Bronchitis, not specified as acute or chronic: Secondary | ICD-10-CM | POA: Diagnosis not present

## 2023-02-11 DIAGNOSIS — J329 Chronic sinusitis, unspecified: Secondary | ICD-10-CM | POA: Diagnosis not present

## 2023-02-11 MED ORDER — PROMETHAZINE-DM 6.25-15 MG/5ML PO SYRP: 5.0000 mL | ORAL_SOLUTION | Freq: Four times a day (QID) | ORAL | 0 refills | Status: DC | PRN

## 2023-02-11 MED ORDER — PREDNISONE 10 MG (21) PO TBPK: ORAL_TABLET | Freq: Every day | ORAL | 0 refills | Status: DC

## 2023-02-11 MED ORDER — AZITHROMYCIN 250 MG PO TABS: ORAL_TABLET | ORAL | 0 refills | Status: DC

## 2023-02-11 MED ORDER — ALBUTEROL SULFATE HFA 108 (90 BASE) MCG/ACT IN AERS: 2.0000 | INHALATION_SPRAY | RESPIRATORY_TRACT | 0 refills | Status: DC | PRN

## 2023-02-11 NOTE — ED Triage Notes (Signed)
Pt presents with a cough, sinus pressure, sneezing, nasal congestion and green mucus x 3 weeks. Pt has tried nasal spray for her symptoms.

## 2023-02-11 NOTE — Discharge Instructions (Signed)
Stop by the pharmacy to pick up your prescriptions. Return to the urgent care or primary care doctor if symptoms do not improve.

## 2023-02-11 NOTE — ED Provider Notes (Signed)
[facility]    CSN: [id number] Arrival date & time: 02/11/23  1510      History   Chief Complaint Chief Complaint  Patient presents with   Facial Pain   Nasal Congestion   Cough   sneezing     HPI Deborah Lopez is a 22 y.o. female.   HPI  History obtained from the patient. Deborah Lopez presents for green nasal discharge, sneezing, coughing and facial pain for 3 weeks. Has been using Afrin consistently but now its making her nose burn.  Cough is keepign her up at night. She has been taking NyQuil at night to help her sleep.    She is a Administrator.  She has history of asthma but hasn't needed an inhaler since she was a kid.        Past Medical History:  Diagnosis Date   Anxiety    Asthma    Depression     Patient Active Problem List   Diagnosis Date Noted   Environmental allergies 12/31/2022   Uses birth control 12/31/2022   URI (upper respiratory infection) 10/11/2022   Recurrent sinus infections 09/04/2022   Acute non-recurrent maxillary sinusitis 08/20/2022   Asthma 09/02/2021   Stress 09/02/2021   Pap smear for cervical cancer screening 09/02/2021    History reviewed. No pertinent surgical history.  OB History   No obstetric history on file.      Home Medications    Prior to Admission medications   Medication Sig Start Date End Date Taking? Authorizing Provider  albuterol (VENTOLIN HFA) 108 (90 Base) MCG/ACT inhaler Inhale 2 puffs into the lungs every 4 (four) hours as needed. 02/11/23  Yes Azile Minardi, DO  azithromycin (ZITHROMAX Z-PAK) 250 MG tablet Take 2 tablets on day 1 then 1 tablet daily 02/11/23  Yes Tanganyika Bowlds, DO  predniSONE (STERAPRED UNI-PAK 21 TAB) 10 MG (21) TBPK tablet Take by mouth daily. Take 6 tabs by mouth daily for 1, then 5 tabs for 1 day, then 4 tabs for 1 day, then 3 tabs for 1 day, then 2 tabs for 1 day, then 1 tab for 1 day. 02/11/23  Yes Faria Casella, DO  promethazine-dextromethorphan  (PROMETHAZINE-DM) 6.25-15 MG/5ML syrup Take 5 mLs by mouth 4 (four) times daily as needed. 02/11/23  Yes Katha Cabal, DO  AUROVELA 1/20 1-20 MG-MCG tablet Take 1 tablet by mouth daily. 11/23/22   [provider]  sertraline (ZOLOFT) 50 MG tablet Take 1/2 tablet q day x 1 week and then one po q day. 12/31/22   Dale [place], MD    Family History Family History  Problem Relation Age of Onset   Arthritis Mother    Depression Mother    Hypertension Mother    Mental illness Mother    Miscarriages / [place] Mother    Alcohol abuse Father    Depression Father    Diabetes Father    Early death Father    Mental illness Father    Asthma Sister    Depression Sister    Anxiety disorder Sister    Miscarriages / [place] Sister    Mental illness Sister    Arthritis Maternal Grandmother    Cancer Maternal Grandfather    Heart attack Maternal Grandfather    Mental illness Paternal Grandmother     Social History Social History   Tobacco Use   Smoking status: Never   Smokeless tobacco: Never  Vaping Use   Vaping status: Never Used  Substance Use Topics  Alcohol use: Yes    Comment: social   Drug use: Never     Allergies   Patient has no known allergies.   Review of Systems Review of Systems: negative unless otherwise stated in HPI.      Physical Exam Triage Vital Signs ED Triage Vitals  Encounter Vitals Group     BP      Systolic BP Percentile      Diastolic BP Percentile      Pulse      Resp      Temp      Temp src      SpO2      Weight      Height      Head Circumference      Peak Flow      Pain Score      Pain Loc      Pain Education      Exclude from Growth Chart    No data found.  Updated Vital Signs BP 118/80 (BP Location: Left Arm)   Pulse 87   Temp 98.5 F (36.9 C) (Oral)   Resp 18   LMP 01/21/2023   SpO2 100%   Visual Acuity Right Eye Distance:   Left Eye Distance:   Bilateral Distance:    Right Eye Near:   Left Eye  Near:    Bilateral Near:     Physical Exam GEN:     alert, non-toxic appearing female in no distress    HENT:  mucus membranes moist, oropharyngeal without lesions or erythema, no tonsillar hypertrophy or exudates,  moderate erythematous edematous turbinates, purulent nasal discharge, maxillary greater than frontal sinus tenderness; bilateral TM normal EYES:   no scleral injection or discharge RESP:  no increased work of breathing, clear to auscultation bilaterally CVS:   regular rate and rhythm Skin:   warm and dry, no rash on visible skin    UC Treatments / Results  Labs (all labs ordered are listed, but only abnormal results are displayed) Labs Reviewed - No data to display  EKG   Radiology No results found.  Procedures Procedures (including critical care time)  Medications Ordered in UC Medications - No data to display  Initial Impression / Assessment and Plan / UC Course  I have reviewed the triage vital signs and the nursing notes.  Pertinent labs & imaging results that were available during my care of the patient were reviewed by me and considered in my medical decision making (see chart for details).       Pt is a 22 y.o. female who presents for 3 weeks of cough that is not improving.  Deborah Lopez is  afebrile here without recent antipyretics. Satting well on room air. Overall pt is  non-toxic appearing, well hydrated, without respiratory distress.  She has significant maxillary and frontal sinus tenderness.  Pulmonary exam is unremarkable except for frequent cough.  After shared decision making, we will not pursue chest x-ray.  COVID  and influenza testing deferred due to length of symptoms.   Treat acute sinobronchitis with steroids and antibiotics as below.  Promethazine DM cough syrup given for cough and allow patient to rest.  Albuterol for bronchospasm.  Typical duration of symptoms discussed. Return and ED precautions given and patient voiced understanding.    Discussed MDM, treatment plan and plan for follow-up with patient who agrees with plan.     Final Clinical Impressions(s) / UC Diagnoses   Final diagnoses:  Sinobronchitis  Discharge Instructions      Stop by the pharmacy to pick up your prescriptions. Return to the urgent care or primary care doctor if symptoms do not improve.      ED Prescriptions     Medication Sig Dispense Auth. Provider   albuterol (VENTOLIN HFA) 108 (90 Base) MCG/ACT inhaler Inhale 2 puffs into the lungs every 4 (four) hours as needed. 6.7 g Kariyah Baugh, DO   azithromycin (ZITHROMAX Z-PAK) 250 MG tablet Take 2 tablets on day 1 then 1 tablet daily 6 tablet Neftaly Inzunza, DO   predniSONE (STERAPRED UNI-PAK 21 TAB) 10 MG (21) TBPK tablet Take by mouth daily. Take 6 tabs by mouth daily for 1, then 5 tabs for 1 day, then 4 tabs for 1 day, then 3 tabs for 1 day, then 2 tabs for 1 day, then 1 tab for 1 day. 21 tablet Daymein Nunnery, DO   promethazine-dextromethorphan (PROMETHAZINE-DM) 6.25-15 MG/5ML syrup Take 5 mLs by mouth 4 (four) times daily as needed. 118 mL Starkeisha Vanwinkle, Seward Meth, DO      I have reviewed the PDMP during this encounter.   Katha Cabal, DO 02/11/23 1609

## 2023-02-18 ENCOUNTER — Other Ambulatory Visit: Payer: Self-pay | Admitting: Internal Medicine

## 2023-02-20 ENCOUNTER — Ambulatory Visit: Payer: 59 | Admitting: Internal Medicine

## 2023-02-20 NOTE — Telephone Encounter (Signed)
I refilled her zoloft 50mg .  She missed her appt today.  I refilled for 30 days.  Need to reschedule appt.

## 2023-02-20 NOTE — Progress Notes (Unsigned)
Subjective:    Patient ID: Deborah Lopez, female    DOB: 2000-10-26, 22 y.o.   MRN: [id number]  Patient here for No chief complaint on file.   HPI Here for a scheduled follow up.  F/u regarding increased anxiety. Started back on zoloft last visit. Evaluated 02/11/23 - sinobronchitis - treated with azithromycin and prednisone.    Past Medical History:  Diagnosis Date   Anxiety    Asthma    Depression    No past surgical history on file. Family History  Problem Relation Age of Onset   Arthritis Mother    Depression Mother    Hypertension Mother    Mental illness Mother    Miscarriages / [place] Mother    Alcohol abuse Father    Depression Father    Diabetes Father    Early death Father    Mental illness Father    Asthma Sister    Depression Sister    Anxiety disorder Sister    Miscarriages / [place] Sister    Mental illness Sister    Arthritis Maternal Grandmother    Cancer Maternal Grandfather    Heart attack Maternal Grandfather    Mental illness Paternal Grandmother    Social History   Socioeconomic History   Marital status: Significant Other    Spouse name: Not on file   Number of children: Not on file   Years of education: Not on file   Highest education level: Bachelor's degree (e.g., BA, AB, BS)  Occupational History   Not on file  Tobacco Use   Smoking status: Never   Smokeless tobacco: Never  Vaping Use   Vaping status: Never Used  Substance and Sexual Activity   Alcohol use: Yes    Comment: social   Drug use: Never   Sexual activity: Not Currently    Comment: Junel  Other Topics Concern   Not on file  Social History Narrative   Not on file   Social Determinants of Health   Financial Resource Strain: Low Risk  (08/20/2022)   Overall Financial Resource Strain (CARDIA)    Difficulty of Paying Living Expenses: Not hard at all  Food Insecurity: No Food Insecurity (08/20/2022)   Hunger Vital Sign    Worried About Running Out of Food in  the Last Year: Never true    Ran Out of Food in the Last Year: Never true  Transportation Needs: No Transportation Needs (08/20/2022)   PRAPARE - Administrator, Civil Service (Medical): No    Lack of Transportation (Non-Medical): No  Physical Activity: Sufficiently Active (08/20/2022)   Exercise Vital Sign    Days of Exercise per Week: 3 days    Minutes of Exercise per Session: 60 min  Stress: Stress Concern Present (08/20/2022)   [employer] of Occupational Health - Occupational Stress Questionnaire    Feeling of Stress : To some extent  Social Connections: Socially Isolated (08/20/2022)   Social Connection and Isolation Panel [NHANES]    Frequency of Communication with Friends and Family: More than three times a week    Frequency of Social Gatherings with Friends and Family: Three times a week    Attends Religious Services: Never    Active Member of Clubs or Organizations: No    Attends Banker Meetings: Not on file    Marital Status: Never married     Review of Systems     Objective:     LMP 01/21/2023  Wt Readings from  Last 3 Encounters:  12/31/22 179 lb (81.2 kg)  11/09/22 160 lb (72.6 kg)  10/10/22 175 lb (79.4 kg)    Physical Exam   Outpatient Encounter Medications as of 02/20/2023  Medication Sig   albuterol (VENTOLIN HFA) 108 (90 Base) MCG/ACT inhaler Inhale 2 puffs into the lungs every 4 (four) hours as needed.   AUROVELA 1/20 1-20 MG-MCG tablet Take 1 tablet by mouth daily.   azithromycin (ZITHROMAX Z-PAK) 250 MG tablet Take 2 tablets on day 1 then 1 tablet daily   predniSONE (STERAPRED UNI-PAK 21 TAB) 10 MG (21) TBPK tablet Take by mouth daily. Take 6 tabs by mouth daily for 1, then 5 tabs for 1 day, then 4 tabs for 1 day, then 3 tabs for 1 day, then 2 tabs for 1 day, then 1 tab for 1 day.   promethazine-dextromethorphan (PROMETHAZINE-DM) 6.25-15 MG/5ML syrup Take 5 mLs by mouth 4 (four) times daily as needed.   sertraline (ZOLOFT)  50 MG tablet Take 1/2 tablet q day x 1 week and then one po q day.   No facility-administered encounter medications on file as of 02/20/2023.     Lab Results  Component Value Date   WBC 8.7 12/31/2022   HGB 13.9 12/31/2022   HCT 41.5 12/31/2022   PLT 377.0 12/31/2022   GLUCOSE 81 12/31/2022   CHOL 137 12/31/2022   TRIG 117.0 12/31/2022   HDL 50.00 12/31/2022   LDLCALC 64 12/31/2022   ALT 21 12/31/2022   AST 17 12/31/2022   NA 139 12/31/2022   K 3.9 12/31/2022   CL 105 12/31/2022   CREATININE 0.80 12/31/2022   BUN 9 12/31/2022   CO2 25 12/31/2022   TSH 1.08 12/31/2022    No results found.     Assessment & Plan:  There are no diagnoses linked to this encounter.   Dale [place], MD

## 2023-02-21 ENCOUNTER — Encounter: Payer: Self-pay | Admitting: Internal Medicine

## 2023-02-21 NOTE — Progress Notes (Signed)
Patient ID: Deborah Lopez, female   DOB: Jan 05, 2001, 22 y.o.   MRN: [id number] Did not show for appt.

## 2023-03-06 ENCOUNTER — Telehealth: Payer: 59 | Admitting: Family Medicine

## 2023-03-06 DIAGNOSIS — J0191 Acute recurrent sinusitis, unspecified: Secondary | ICD-10-CM

## 2023-03-06 MED ORDER — AMOXICILLIN-POT CLAVULANATE 875-125 MG PO TABS: 1.0000 | ORAL_TABLET | Freq: Two times a day (BID) | ORAL | 0 refills | Status: DC

## 2023-03-06 NOTE — Progress Notes (Signed)
Virtual Visit Consent   Deborah Lopez, you are scheduled for a virtual visit with a [facility] provider today. Just as with appointments in the office, your consent must be obtained to participate. Your consent will be active for this visit and any virtual visit you may have with one of our providers in the next 365 days. If you have a MyChart account, a copy of this consent can be sent to you electronically.  As this is a virtual visit, video technology does not allow for your provider to perform a traditional examination. This may limit your provider's ability to fully assess your condition. If your provider identifies any concerns that need to be evaluated in person or the need to arrange testing (such as labs, EKG, etc.), we will make arrangements to do so. Although advances in technology are sophisticated, we cannot ensure that it will always work on either your end or our end. If the connection with a video visit is poor, the visit may have to be switched to a telephone visit. With either a video or telephone visit, we are not always able to ensure that we have a secure connection.  By engaging in this virtual visit, you consent to the provision of healthcare and authorize for your insurance to be billed (if applicable) for the services provided during this visit. Depending on your insurance coverage, you may receive a charge related to this service.  I need to obtain your verbal consent now. Are you willing to proceed with your visit today? Deborah Lopez has provided verbal consent on 03/06/2023 for a virtual visit (video or telephone). Reed Pandy, [place]  Date: 03/06/2023 7:47 PM  Virtual Visit via Video Note   I, Reed Pandy, connected with  Deborah Lopez  ([id number], [birth year]) on 03/06/23 at  7:45 PM EST by a video-enabled telemedicine application and verified that I am speaking with the correct person using two identifiers.  Location: Patient: Virtual Visit Location Patient:  Home Provider: Virtual Visit Location Provider: Home Office   I discussed the limitations of evaluation and management by telemedicine and the availability of in person appointments. The patient expressed understanding and agreed to proceed.    History of Present Illness: Deborah Lopez is a 22 y.o. who identifies as a female who was assigned female at birth, and is being seen today for c/o feeling sick.  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  Pt states she was diagnosed with a sinus infection a month ago.  Pt states symptoms on going for about three weeks. Pt states she is coughing, throat pain, sinus pain and sinus headache and runny nose. Pt states taking Tylenol and nasal spray. Pt denies concern for pregnancy. Pt states last dose of steroids was 3-4 weeks ago.   HPI: HPI  Problems:  Patient Active Problem List   Diagnosis Date Noted   Environmental allergies 12/31/2022   Uses birth control 12/31/2022   URI (upper respiratory infection) 10/11/2022   Recurrent sinus infections 09/04/2022   Acute non-recurrent maxillary sinusitis 08/20/2022   Asthma 09/02/2021   Stress 09/02/2021   Pap smear for cervical cancer screening 09/02/2021    Allergies: No Known Allergies Medications:  Current Outpatient Medications:    amoxicillin-clavulanate (AUGMENTIN) 875-125 MG tablet, Take 1 tablet by mouth 2 (two) times daily., Disp: 20 tablet, Rfl: 0   albuterol (VENTOLIN HFA) 108 (90 Base) MCG/ACT inhaler, Inhale 2 puffs into the lungs every 4 (four) hours as needed., Disp: 6.7 g, Rfl:  0   AUROVELA 1/20 1-20 MG-MCG tablet, Take 1 tablet by mouth daily., Disp: , Rfl:    azithromycin (ZITHROMAX Z-PAK) 250 MG tablet, Take 2 tablets on day 1 then 1 tablet daily, Disp: 6 tablet, Rfl: 0   predniSONE (STERAPRED UNI-PAK 21 TAB) 10 MG (21) TBPK tablet, Take by mouth daily. Take 6 tabs by mouth daily for 1, then 5 tabs for 1 day, then 4 tabs for 1 day, then 3 tabs for 1 day, then 2 tabs for 1 day, then 1 tab for 1 day.,  Disp: 21 tablet, Rfl: 0   promethazine-dextromethorphan (PROMETHAZINE-DM) 6.25-15 MG/5ML syrup, Take 5 mLs by mouth 4 (four) times daily as needed., Disp: 118 mL, Rfl: 0   sertraline (ZOLOFT) 50 MG tablet, Take 1 tablet (50 mg total) by mouth daily., Disp: 30 tablet, Rfl: 0  Observations/Objective: Patient is well-developed, well-nourished in no acute distress.  Resting comfortably at home.  Head is normocephalic, atraumatic.  No labored breathing.  Speech is clear and coherent with logical content.  Patient is alert and oriented at baseline.    Assessment and Plan: 1. Acute recurrent sinusitis, unspecified location - amoxicillin-clavulanate (AUGMENTIN) 875-125 MG tablet; Take 1 tablet by mouth 2 (two) times daily.  Dispense: 20 tablet; Refill: 0  -Advised Pt to follow up with PCP or urgent care for worsening symptoms -Pt to continue Tylenol and nasal spray as prescribed  Follow Up Instructions: I discussed the assessment and treatment plan with the patient. The patient was provided an opportunity to ask questions and all were answered. The patient agreed with the plan and demonstrated an understanding of the instructions.  A copy of instructions were sent to the patient via MyChart unless otherwise noted below.     The patient was advised to call back or seek an in-person evaluation if the symptoms worsen or if the condition fails to improve as anticipated.    Reed Pandy, PA-C

## 2023-03-06 NOTE — Patient Instructions (Signed)
Deborah Lopez, thank you for joining Reed Pandy, PA-C for today's virtual visit.  While this provider is not your primary care provider (PCP), if your PCP is located in our provider database this encounter information will be shared with them immediately following your visit.   A [facility] MyChart account gives you access to today's visit and all your visits, tests, and labs performed at [facility] " click here if you don't have a [facility] MyChart account or go to [email or web address]  Consent: (Patient) Deborah Lopez provided verbal consent for this virtual visit at the beginning of the encounter.  Current Medications:  Current Outpatient Medications:    amoxicillin-clavulanate (AUGMENTIN) 875-125 MG tablet, Take 1 tablet by mouth 2 (two) times daily., Disp: 20 tablet, Rfl: 0   albuterol (VENTOLIN HFA) 108 (90 Base) MCG/ACT inhaler, Inhale 2 puffs into the lungs every 4 (four) hours as needed., Disp: 6.7 g, Rfl: 0   AUROVELA 1/20 1-20 MG-MCG tablet, Take 1 tablet by mouth daily., Disp: , Rfl:    azithromycin (ZITHROMAX Z-PAK) 250 MG tablet, Take 2 tablets on day 1 then 1 tablet daily, Disp: 6 tablet, Rfl: 0   predniSONE (STERAPRED UNI-PAK 21 TAB) 10 MG (21) TBPK tablet, Take by mouth daily. Take 6 tabs by mouth daily for 1, then 5 tabs for 1 day, then 4 tabs for 1 day, then 3 tabs for 1 day, then 2 tabs for 1 day, then 1 tab for 1 day., Disp: 21 tablet, Rfl: 0   promethazine-dextromethorphan (PROMETHAZINE-DM) 6.25-15 MG/5ML syrup, Take 5 mLs by mouth 4 (four) times daily as needed., Disp: 118 mL, Rfl: 0   sertraline (ZOLOFT) 50 MG tablet, Take 1 tablet (50 mg total) by mouth daily., Disp: 30 tablet, Rfl: 0   Medications ordered in this encounter:  Meds ordered this encounter  Medications   amoxicillin-clavulanate (AUGMENTIN) 875-125 MG tablet    Sig: Take 1 tablet by mouth 2 (two) times daily.    Dispense:  20 tablet    Refill:  0     *If you need refills on other  medications prior to your next appointment, please contact your pharmacy*  Follow-Up: Call back or seek an in-person evaluation if the symptoms worsen or if the condition fails to improve as anticipated.  [facility] [phone]  Other Instructions Sinus Infection, Adult A sinus infection, also called sinusitis, is inflammation of your sinuses. Sinuses are hollow spaces in the bones around your face. Your sinuses are located: Around your eyes. In the middle of your forehead. Behind your nose. In your cheekbones. Mucus normally drains out of your sinuses. When your nasal tissues become inflamed or swollen, mucus can become trapped or blocked. This allows bacteria, viruses, and fungi to grow, which leads to infection. Most infections of the sinuses are caused by a virus. A sinus infection can develop quickly. It can last for up to 4 weeks (acute) or for more than 12 weeks (chronic). A sinus infection often develops after a cold. What are the causes? This condition is caused by anything that creates swelling in the sinuses or stops mucus from draining. This includes: Allergies. Asthma. Infection from bacteria or viruses. Deformities or blockages in your nose or sinuses. Abnormal growths in the nose (nasal polyps). Pollutants, such as chemicals or irritants in the air. Infection from fungi. This is rare. What increases the risk? You are more likely to develop this condition if you: Have a weak body defense system (  immune system). Do a lot of swimming or diving. Overuse nasal sprays. Smoke. What are the signs or symptoms? The main symptoms of this condition are pain and a feeling of pressure around the affected sinuses. Other symptoms include: Stuffy nose or congestion that makes it difficult to breathe through your nose. Thick yellow or greenish drainage from your nose. Tenderness, swelling, and warmth over the affected sinuses. A cough that may get worse at  night. Decreased sense of smell and taste. Extra mucus that collects in the throat or the back of the nose (postnasal drip) causing a sore throat or bad breath. Tiredness (fatigue). Fever. How is this diagnosed? This condition is diagnosed based on: Your symptoms. Your medical history. A physical exam. Tests to find out if your condition is acute or chronic. This may include: Checking your nose for nasal polyps. Viewing your sinuses using a device that has a light (endoscope). Testing for allergies or bacteria. Imaging tests, such as an MRI or CT scan. In rare cases, a bone biopsy may be done to rule out more serious types of fungal sinus disease. How is this treated? Treatment for a sinus infection depends on the cause and whether your condition is chronic or acute. If caused by a virus, your symptoms should go away on their own within 10 days. You may be given medicines to relieve symptoms. They include: Medicines that shrink swollen nasal passages (decongestants). A spray that eases inflammation of the nostrils (topical intranasal corticosteroids). Rinses that help get rid of thick mucus in your nose (nasal saline washes). Medicines that treat allergies (antihistamines). Over-the-counter pain relievers. If caused by bacteria, your health care provider may recommend waiting to see if your symptoms improve. Most bacterial infections will get better without antibiotic medicine. You may be given antibiotics if you have: A severe infection. A weak immune system. If caused by narrow nasal passages or nasal polyps, surgery may be needed. Follow these instructions at home: Medicines Take, use, or apply over-the-counter and prescription medicines only as told by your health care provider. These may include nasal sprays. If you were prescribed an antibiotic medicine, take it as told by your health care provider. Do not stop taking the antibiotic even if you start to feel better. Hydrate and  humidify  Drink enough fluid to keep your urine pale yellow. Staying hydrated will help to thin your mucus. Use a cool mist humidifier to keep the humidity level in your home above 50%. Inhale steam for 10-15 minutes, 3-4 times a day, or as told by your health care provider. You can do this in the bathroom while a hot shower is running. Limit your exposure to cool or dry air. Rest Rest as much as possible. Sleep with your head raised (elevated). Make sure you get enough sleep each night. General instructions  Apply a warm, moist washcloth to your face 3-4 times a day or as told by your health care provider. This will help with discomfort. Use nasal saline washes as often as told by your health care provider. Wash your hands often with soap and water to reduce your exposure to germs. If soap and water are not available, use hand sanitizer. Do not smoke. Avoid being around people who are smoking (secondhand smoke). Keep all follow-up visits. This is important. Contact a health care provider if: You have a fever. Your symptoms get worse. Your symptoms do not improve within 10 days. Get help right away if: You have a severe headache. You have  persistent vomiting. You have severe pain or swelling around your face or eyes. You have vision problems. You develop confusion. Your neck is stiff. You have trouble breathing. These symptoms may be an emergency. Get help right away. Call 911. Do not wait to see if the symptoms will go away. Do not drive yourself to the hospital. Summary A sinus infection is soreness and inflammation of your sinuses. Sinuses are hollow spaces in the bones around your face. This condition is caused by nasal tissues that become inflamed or swollen. The swelling traps or blocks the flow of mucus. This allows bacteria, viruses, and fungi to grow, which leads to infection. If you were prescribed an antibiotic medicine, take it as told by your health care provider. Do  not stop taking the antibiotic even if you start to feel better. Keep all follow-up visits. This is important. This information is not intended to replace advice given to you by your health care provider. Make sure you discuss any questions you have with your health care provider. Document Revised: 02/26/2021 Document Reviewed: 02/26/2021 Elsevier Patient Education  2024 Elsevier Inc.    If you have been instructed to have an in-person evaluation today at a local Urgent Care facility, please use the link below. It will take you to a list of all of our available [facility], including address, phone number and hours of operation. Please do not delay care.  [facility]  If you or a family member do not have a primary care provider, use the link below to schedule a visit and establish care. When you choose a [facility] primary care physician or advanced practice provider, you gain a long-term partner in health. Find a Primary Care Provider  Learn more about [facility]'s in-office and virtual care options: [facility] - Get Care Now

## 2023-03-20 ENCOUNTER — Other Ambulatory Visit: Payer: Self-pay | Admitting: Internal Medicine

## 2023-04-25 ENCOUNTER — Other Ambulatory Visit: Payer: Self-pay | Admitting: Internal Medicine

## 2023-05-17 ENCOUNTER — Other Ambulatory Visit: Payer: Self-pay | Admitting: Internal Medicine

## 2023-05-19 NOTE — Telephone Encounter (Signed)
Medication was refilled 2 weeks ago is it okay to refuse the refill request?

## 2023-05-19 NOTE — Telephone Encounter (Signed)
Rx was refilled 04/2023 for 90 tablets. Please call her and schedule f/u appt. Needs f/u appt scheduled.

## 2023-05-20 ENCOUNTER — Telehealth: Payer: Self-pay

## 2023-05-20 NOTE — Telephone Encounter (Signed)
Error

## 2023-05-20 NOTE — Telephone Encounter (Signed)
Noted

## 2023-05-20 NOTE — Telephone Encounter (Signed)
I left voicemail for patient asking her to please call us.  When patient returns our call, please schedule her for a follow-up visit with Dr. Dale [place] (per Dr. Lorin Picket).

## 2023-05-20 NOTE — Telephone Encounter (Signed)
Please call and schedule a follow up appointment

## 2023-06-24 ENCOUNTER — Telehealth: Admitting: Physician Assistant

## 2023-06-24 DIAGNOSIS — R3989 Other symptoms and signs involving the genitourinary system: Secondary | ICD-10-CM | POA: Diagnosis not present

## 2023-06-24 MED ORDER — CEPHALEXIN 500 MG PO CAPS: 500.0000 mg | ORAL_CAPSULE | Freq: Two times a day (BID) | ORAL | 0 refills | Status: AC

## 2023-06-24 NOTE — Progress Notes (Signed)
 I have spent 5 minutes in review of e-visit questionnaire, review and updating patient chart, medical decision making and response to patient.   Piedad Climes, PA-C

## 2023-06-24 NOTE — Progress Notes (Signed)

## 2023-08-02 NOTE — Progress Notes (Unsigned)
 32  Subjective:    Patient ID: Deborah Lopez, female    DOB: 06-28-2000, 23 y.o.   MRN: [id number]  Patient here for  Chief Complaint  Patient presents with   Anxiety    HPI Here for a scheduled follow up - follow up regarding increased stress.  Taking zoloft . On 50mg  q day. Tolerating. 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 Feels the zoloft  is working, but feels she needs a higher dose, just to help level things out.  Stays active. Does yoga. No chest pain or sob reported. She is having some allergy symptoms. Increased nasal congestion. Taking zyrtec  and using saline nasal spray. No increased cough or chest congestion. No bowel issues. Periods regular. Light. Taking ocp's regularly. Saw gyn last month. Everything checked out ok.    Past Medical History:  Diagnosis Date   Anxiety    Asthma    Depression    History reviewed. No pertinent surgical history. Family History  Problem Relation Age of Onset   Arthritis Mother    Depression Mother    Hypertension Mother    Mental illness Mother    Miscarriages / [place] Mother    Alcohol abuse Father    Depression Father    Diabetes Father    Early death Father    Mental illness Father    Asthma Sister    Depression Sister    Anxiety disorder Sister    Miscarriages / [place] Sister    Mental illness Sister    Arthritis Maternal Grandmother    Cancer Maternal Grandfather    Heart attack Maternal Grandfather    Mental illness Paternal Grandmother    Social History   Socioeconomic History   Marital status: Significant Other    Spouse name: Not on file   Number of children: Not on file   Years of education: Not on file   Highest education level: Bachelor's degree (e.g., BA, AB, BS)  Occupational History   Not on file  Tobacco Use   Smoking status: Never   Smokeless tobacco: Never  Vaping Use   Vaping status: Never Used  Substance and Sexual Activity   Alcohol use: Yes    Comment: social   Drug use: Never    Sexual activity: Not Currently    Comment: [place]  Other Topics Concern   Not on file  Social History Narrative   Not on file   Social Drivers of Health   Financial Resource Strain: Low Risk  (08/02/2023)   Overall Financial Resource Strain (CARDIA)    Difficulty of Paying Living Expenses: Not very hard  Food Insecurity: No Food Insecurity (08/02/2023)   Hunger Vital Sign    Worried About Running Out of Food in the Last Year: Never true    Ran Out of Food in the Last Year: Never true  Transportation Needs: No Transportation Needs (08/02/2023)   PRAPARE - Administrator, Civil Service (Medical): No    Lack of Transportation (Non-Medical): No  Physical Activity: Sufficiently Active (08/02/2023)   Exercise Vital Sign    Days of Exercise per Week: 6 days    Minutes of Exercise per Session: 80 min  Stress: Stress Concern Present (08/02/2023)   [employer] of Occupational Health - Occupational Stress Questionnaire    Feeling of Stress : Rather much  Social Connections: Moderately Isolated (08/02/2023)   Social Connection and Isolation Panel [NHANES]    Frequency of Communication with Friends and Family: More than three times a  week    Frequency of Social Gatherings with Friends and Family: More than three times a week    Attends Religious Services: Never    Database administrator or Organizations: No    Attends Engineer, structural: Not on file    Marital Status: Living with partner     Review of Systems  Constitutional:  Negative for appetite change and unexpected weight change.  HENT:  Positive for congestion. Negative for sore throat.   Respiratory:  Negative for cough, chest tightness and shortness of breath.   Cardiovascular:  Negative for chest pain, palpitations and leg swelling.  Gastrointestinal:  Negative for abdominal pain, diarrhea, nausea and vomiting.  Genitourinary:  Negative for difficulty urinating and dysuria.  Musculoskeletal:  Negative for  joint swelling and myalgias.  Skin:  Negative for color change and rash.  Neurological:  Negative for dizziness and headaches.  Psychiatric/Behavioral:  Negative for agitation and dysphoric mood.        Increased stress as outlined.        Objective:     BP 110/68   Pulse 82   Temp 98 F (36.7 C)   Resp 16   Ht 5\' 1"  (1.549 m)   Wt 169 lb (76.7 kg)   SpO2 98%   BMI 31.93 kg/m  Wt Readings from Last 3 Encounters:  08/03/23 169 lb (76.7 kg)  12/31/22 179 lb (81.2 kg)  11/09/22 160 lb (72.6 kg)    Physical Exam Vitals reviewed.  Constitutional:      General: She is not in acute distress.    Appearance: Normal appearance.  HENT:     Head: Normocephalic and atraumatic.     Right Ear: External ear normal.     Left Ear: External ear normal.     Mouth/Throat:     Pharynx: No oropharyngeal exudate or posterior oropharyngeal erythema.  Eyes:     General: No scleral icterus.       Right eye: No discharge.        Left eye: No discharge.     Conjunctiva/sclera: Conjunctivae normal.  Neck:     Thyroid: No thyromegaly.  Cardiovascular:     Rate and Rhythm: Normal rate and regular rhythm.  Pulmonary:     Effort: No respiratory distress.     Breath sounds: Normal breath sounds. No wheezing.  Abdominal:     General: Bowel sounds are normal.     Palpations: Abdomen is soft.     Tenderness: There is no abdominal tenderness.  Musculoskeletal:        General: No swelling or tenderness.     Cervical back: Neck supple. No tenderness.  Lymphadenopathy:     Cervical: No cervical adenopathy.  Skin:    Findings: No erythema or rash.  Neurological:     Mental Status: She is alert.  Psychiatric:        Mood and Affect: Mood normal.        Behavior: Behavior normal.         Outpatient Encounter Medications as of 08/03/2023  Medication Sig   sertraline  (ZOLOFT ) 100 MG tablet Take 1 tablet (100 mg total) by mouth daily.   AUROVELA 1/20 1-20 MG-MCG tablet Take 1 tablet by  mouth daily.   [DISCONTINUED] albuterol  (VENTOLIN  HFA) 108 (90 Base) MCG/ACT inhaler Inhale 2 puffs into the lungs every 4 (four) hours as needed.   [DISCONTINUED] sertraline  (ZOLOFT ) 50 MG tablet TAKE 1 TABLET BY MOUTH EVERY DAY  No facility-administered encounter medications on file as of 08/03/2023.     Lab Results  Component Value Date   WBC 8.7 12/31/2022   HGB 13.9 12/31/2022   HCT 41.5 12/31/2022   PLT 377.0 12/31/2022   GLUCOSE 81 12/31/2022   CHOL 137 12/31/2022   TRIG 117.0 12/31/2022   HDL 50.00 12/31/2022   LDLCALC 64 12/31/2022   ALT 21 12/31/2022   AST 17 12/31/2022   NA 139 12/31/2022   K 3.9 12/31/2022   CL 105 12/31/2022   CREATININE 0.80 12/31/2022   BUN 9 12/31/2022   CO2 25 12/31/2022   TSH 1.08 12/31/2022       Assessment & Plan:  Environmental allergies Assessment & Plan: Taking zyrtec . Using saline nasal spray. Add steroid nasal spray. Notify if persistent symptoms.    Pap smear for cervical cancer screening Assessment & Plan: Saw gyn recently. States everything checked out ok. Obtain records.    Stress Assessment & Plan: Increased stress/anxiety. Doing better. Feels better. Tolerating zoloft  50mg  q day.  Desires to increase dose. Will increase to 100mg  q day. Follow.    Other orders -     Sertraline  HCl; Take 1 tablet (100 mg total) by mouth daily.  Dispense: 30 tablet; Refill: 3     Dellar Fenton, MD

## 2023-08-03 ENCOUNTER — Encounter: Payer: Self-pay | Admitting: Internal Medicine

## 2023-08-03 ENCOUNTER — Ambulatory Visit: Admitting: Internal Medicine

## 2023-08-03 VITALS — BP 110/68 | HR 82 | Temp 98.0°F | Resp 16 | Ht 61.0 in | Wt 169.0 lb

## 2023-08-03 DIAGNOSIS — Z124 Encounter for screening for malignant neoplasm of cervix: Secondary | ICD-10-CM | POA: Diagnosis not present

## 2023-08-03 DIAGNOSIS — Z9109 Other allergy status, other than to drugs and biological substances: Secondary | ICD-10-CM | POA: Diagnosis not present

## 2023-08-03 DIAGNOSIS — F439 Reaction to severe stress, unspecified: Secondary | ICD-10-CM

## 2023-08-03 MED ORDER — SERTRALINE HCL 100 MG PO TABS: 100.0000 mg | ORAL_TABLET | Freq: Every day | ORAL | 3 refills | Status: DC

## 2023-08-03 NOTE — Patient Instructions (Signed)
 Nasacort  nasal spray (of flonase ) - 2 sprays each nostril one time per day.

## 2023-08-03 NOTE — Assessment & Plan Note (Signed)
 Increased stress/anxiety. Doing better. Feels better. Tolerating zoloft  50mg  q day.  Desires to increase dose. Will increase to 100mg  q day. Follow.

## 2023-08-03 NOTE — Assessment & Plan Note (Signed)
 Taking zyrtec . Using saline nasal spray. Add steroid nasal spray. Notify if persistent symptoms.

## 2023-08-03 NOTE — Assessment & Plan Note (Signed)
 Saw gyn recently. States everything checked out ok. Obtain records.

## 2023-08-12 ENCOUNTER — Encounter

## 2023-09-18 ENCOUNTER — Telehealth: Admitting: Emergency Medicine

## 2023-09-18 DIAGNOSIS — J329 Chronic sinusitis, unspecified: Secondary | ICD-10-CM

## 2023-09-18 MED ORDER — AMOXICILLIN-POT CLAVULANATE 875-125 MG PO TABS: 1.0000 | ORAL_TABLET | Freq: Two times a day (BID) | ORAL | 0 refills | Status: DC

## 2023-09-18 NOTE — Progress Notes (Signed)
E-Visit for Sinus Problems  We are sorry that you are not feeling well.  Here is how we plan to help!  Based on what you have shared with me it looks like you have sinusitis.  Sinusitis is inflammation and infection in the sinus cavities of the head.  Based on your presentation I believe you most likely have Acute Bacterial Sinusitis.  This is an infection caused by bacteria and is treated with antibiotics. I have prescribed Augmentin 875mg/125mg one tablet twice daily with food, for 7 days. You may use an oral decongestant such as Mucinex D or if you have glaucoma or high blood pressure use plain Mucinex. Saline nasal spray help and can safely be used as often as needed for congestion.  If you develop worsening sinus pain, fever or notice severe headache and vision changes, or if symptoms are not better after completion of antibiotic, please schedule an appointment with a health care provider.    Sinus infections are not as easily transmitted as other respiratory infection, however we still recommend that you avoid close contact with loved ones, especially the very young and elderly.  Remember to wash your hands thoroughly throughout the day as this is the number one way to prevent the spread of infection!  Home Care: Only take medications as instructed by your medical team. Complete the entire course of an antibiotic. Do not take these medications with alcohol. A steam or ultrasonic humidifier can help congestion.  You can place a towel over your head and breathe in the steam from hot water coming from a faucet. Avoid close contacts especially the very young and the elderly. Cover your mouth when you cough or sneeze. Always remember to wash your hands.  Get Help Right Away If: You develop worsening fever or sinus pain. You develop a severe head ache or visual changes. Your symptoms persist after you have completed your treatment plan.  Make sure you Understand these instructions. Will watch  your condition. Will get help right away if you are not doing well or get worse.  Thank you for choosing an e-visit.  Your e-visit answers were reviewed by a board certified advanced clinical practitioner to complete your personal care plan. Depending upon the condition, your plan could have included both over the counter or prescription medications.  Please review your pharmacy choice. Make sure the pharmacy is open so you can pick up prescription now. If there is a problem, you may contact your provider through MyChart messaging and have the prescription routed to another pharmacy.  Your safety is important to us. If you have drug allergies check your prescription carefully.   For the next 24 hours you can use MyChart to ask questions about today's visit, request a non-urgent call back, or ask for a work or school excuse. You will get an email in the next two days asking about your experience. I hope that your e-visit has been valuable and will speed your recovery.  Approximately 5 minutes was used in reviewing the patient's chart, questionnaire, prescribing medications, and documentation.  

## 2023-10-26 ENCOUNTER — Other Ambulatory Visit: Payer: Self-pay | Admitting: Internal Medicine

## 2023-10-26 NOTE — Telephone Encounter (Signed)
 Rx ok'd for zoloft  #90 with no refills.

## 2023-10-29 ENCOUNTER — Telehealth: Admitting: Physician Assistant

## 2023-10-29 DIAGNOSIS — B9689 Other specified bacterial agents as the cause of diseases classified elsewhere: Secondary | ICD-10-CM

## 2023-10-29 DIAGNOSIS — J019 Acute sinusitis, unspecified: Secondary | ICD-10-CM

## 2023-10-29 MED ORDER — DOXYCYCLINE HYCLATE 100 MG PO TABS: 100.0000 mg | ORAL_TABLET | Freq: Two times a day (BID) | ORAL | 0 refills | Status: DC

## 2023-10-29 NOTE — Progress Notes (Signed)
 I have spent 5 minutes in review of e-visit questionnaire, review and updating patient chart, medical decision making and response to patient.   Piedad Climes, PA-C

## 2023-10-29 NOTE — Progress Notes (Signed)

## 2023-11-04 ENCOUNTER — Ambulatory Visit: Admitting: Internal Medicine

## 2023-11-04 ENCOUNTER — Encounter: Payer: Self-pay | Admitting: Internal Medicine

## 2023-11-04 VITALS — BP 110/70 | HR 90 | Resp 16 | Ht 61.0 in | Wt 167.4 lb

## 2023-11-04 DIAGNOSIS — F439 Reaction to severe stress, unspecified: Secondary | ICD-10-CM | POA: Diagnosis not present

## 2023-11-04 DIAGNOSIS — J329 Chronic sinusitis, unspecified: Secondary | ICD-10-CM

## 2023-11-04 DIAGNOSIS — Z9109 Other allergy status, other than to drugs and biological substances: Secondary | ICD-10-CM | POA: Diagnosis not present

## 2023-11-04 MED ORDER — SERTRALINE HCL 100 MG PO TABS: 100.0000 mg | ORAL_TABLET | Freq: Every day | ORAL | 1 refills | Status: DC

## 2023-11-04 NOTE — Assessment & Plan Note (Signed)
 100mg  zoloft  working well for her. Continue current dose. Follow.

## 2023-11-04 NOTE — Assessment & Plan Note (Signed)
 On doxycycline  now. Last dose today. Doing better. Discussed continuing saline nasal spray and steroid nasal spray for prevention. Taking zyrtec . Discussed ENT referral if persistent recurring sinus issues.

## 2023-11-04 NOTE — Progress Notes (Signed)
 Subjective:    Patient ID: Deborah Lopez, female    DOB: [birth year], 23 y.o.   MRN: [id number]  Patient here for  Chief Complaint  Patient presents with   Medical Management of Chronic Issues    HPI Here for a scheduled follow up - follow up regarding increased anxiety. On zoloft . Tolerating. Dose increased to 100mg  last visit. Feels this dose of zoloft  is working well. Continues zyrtec  for allergies. Had E-visit 10/29/23 - diagnosed with sinus infection. Treated with doxycycline  and mucinex D. Also recommended saline nasal spray. She has one dose of doxycycline  left. Feeling better. No increased sinus congestion currently. Cough is better. No sob. No bowel change reported.    Past Medical History:  Diagnosis Date   Anxiety    Asthma    Depression    History reviewed. No pertinent surgical history. Family History  Problem Relation Age of Onset   Arthritis Mother    Depression Mother    Hypertension Mother    Mental illness Mother    Miscarriages / [place] Mother    Alcohol abuse Father    Depression Father    Diabetes Father    Early death Father    Mental illness Father    Asthma Sister    Depression Sister    Anxiety disorder Sister    Miscarriages / [place] Sister    Mental illness Sister    Arthritis Maternal Grandmother    Cancer Maternal Grandfather    Heart attack Maternal Grandfather    Mental illness Paternal Grandmother    Social History   Socioeconomic History   Marital status: Significant Other    Spouse name: Not on file   Number of children: Not on file   Years of education: Not on file   Highest education level: Bachelor's degree (e.g., BA, AB, BS)  Occupational History   Not on file  Tobacco Use   Smoking status: Never   Smokeless tobacco: Never  Vaping Use   Vaping status: Never Used  Substance and Sexual Activity   Alcohol use: Yes    Comment: social   Drug use: Never   Sexual activity: Not Currently    Comment: Junel  Other  Topics Concern   Not on file  Social History Narrative   Not on file   Social Drivers of Health   Financial Resource Strain: Low Risk  (11/03/2023)   Overall Financial Resource Strain (CARDIA)    Difficulty of Paying Living Expenses: Not very hard  Food Insecurity: No Food Insecurity (11/03/2023)   Hunger Vital Sign    Worried About Running Out of Food in the Last Year: Never true    Ran Out of Food in the Last Year: Never true  Transportation Needs: No Transportation Needs (11/03/2023)   PRAPARE - Administrator, Civil Service (Medical): No    Lack of Transportation (Non-Medical): No  Physical Activity: Sufficiently Active (11/03/2023)   Exercise Vital Sign    Days of Exercise per Week: 5 days    Minutes of Exercise per Session: 90 min  Stress: Stress Concern Present (11/03/2023)   [employer] of Occupational Health - Occupational Stress Questionnaire    Feeling of Stress: To some extent  Social Connections: Moderately Isolated (11/03/2023)   Social Connection and Isolation Panel    Frequency of Communication with Friends and Family: More than three times a week    Frequency of Social Gatherings with Friends and Family: More than three times a week  Attends Religious Services: Never    Active Member of Clubs or Organizations: No    Attends Engineer, structural: Not on file    Marital Status: Living with partner     Review of Systems  Constitutional:  Negative for appetite change and unexpected weight change.  HENT:  Negative for sinus pressure.        Sinus congestion better.   Respiratory:  Negative for chest tightness and shortness of breath.        Cough better.   Cardiovascular:  Negative for chest pain, palpitations and leg swelling.  Gastrointestinal:  Negative for abdominal pain, diarrhea, nausea and vomiting.  Genitourinary:  Negative for difficulty urinating and dysuria.  Musculoskeletal:  Negative for joint swelling and myalgias.  Skin:   Negative for color change and rash.  Neurological:  Negative for dizziness and headaches.  Psychiatric/Behavioral:  Negative for agitation and dysphoric mood.        Objective:     BP 110/70   Pulse 90   Resp 16   Ht 5' 1 (1.549 m)   Wt 167 lb 6.4 oz (75.9 kg)   SpO2 99%   BMI 31.63 kg/m  Wt Readings from Last 3 Encounters:  11/04/23 167 lb 6.4 oz (75.9 kg)  08/03/23 169 lb (76.7 kg)  12/31/22 179 lb (81.2 kg)    Physical Exam Vitals reviewed.  Constitutional:      General: She is not in acute distress.    Appearance: Normal appearance.  HENT:     Head: Normocephalic and atraumatic.     Comments: No sinus tenderness to palpation.     Right Ear: External ear normal.     Left Ear: External ear normal.     Mouth/Throat:     Pharynx: No oropharyngeal exudate or posterior oropharyngeal erythema.  Eyes:     General: No scleral icterus.       Right eye: No discharge.        Left eye: No discharge.     Conjunctiva/sclera: Conjunctivae normal.  Neck:     Thyroid: No thyromegaly.  Cardiovascular:     Rate and Rhythm: Normal rate and regular rhythm.  Pulmonary:     Effort: No respiratory distress.     Breath sounds: Normal breath sounds. No wheezing.  Abdominal:     General: Bowel sounds are normal.     Palpations: Abdomen is soft.     Tenderness: There is no abdominal tenderness.  Musculoskeletal:        General: No swelling or tenderness.     Cervical back: Neck supple. No tenderness.  Lymphadenopathy:     Cervical: No cervical adenopathy.  Skin:    Findings: No erythema or rash.  Neurological:     Mental Status: She is alert.  Psychiatric:        Mood and Affect: Mood normal.        Behavior: Behavior normal.         Outpatient Encounter Medications as of 11/04/2023  Medication Sig   AUROVELA 1/20 1-20 MG-MCG tablet Take 1 tablet by mouth daily.   [DISCONTINUED] sertraline  (ZOLOFT ) 100 MG tablet TAKE 1 TABLET BY MOUTH EVERY DAY   sertraline  (ZOLOFT )  100 MG tablet Take 1 tablet (100 mg total) by mouth daily.   [DISCONTINUED] amoxicillin -clavulanate (AUGMENTIN ) 875-125 MG tablet Take 1 tablet by mouth every 12 (twelve) hours.   [DISCONTINUED] doxycycline  (VIBRA -TABS) 100 MG tablet Take 1 tablet (100 mg total) by mouth 2 (two)  times daily.   No facility-administered encounter medications on file as of 11/04/2023.     Lab Results  Component Value Date   WBC 8.7 12/31/2022   HGB 13.9 12/31/2022   HCT 41.5 12/31/2022   PLT 377.0 12/31/2022   GLUCOSE 81 12/31/2022   CHOL 137 12/31/2022   TRIG 117.0 12/31/2022   HDL 50.00 12/31/2022   LDLCALC 64 12/31/2022   ALT 21 12/31/2022   AST 17 12/31/2022   NA 139 12/31/2022   K 3.9 12/31/2022   CL 105 12/31/2022   CREATININE 0.80 12/31/2022   BUN 9 12/31/2022   CO2 25 12/31/2022   TSH 1.08 12/31/2022       Assessment & Plan:  Recurrent sinus infections Assessment & Plan: On doxycycline  now. Last dose today. Doing better. Discussed continuing saline nasal spray and steroid nasal spray for prevention. Taking zyrtec . Discussed ENT referral if persistent recurring sinus issues.    Stress Assessment & Plan: 100mg  zoloft  working well for her. Continue current dose. Follow.    Environmental allergies Assessment & Plan: Continue steroid nasal spray and zyrtec  as directed. Follow.    Other orders -     Sertraline  HCl; Take 1 tablet (100 mg total) by mouth daily.  Dispense: 90 tablet; Refill: 1     Allena Hamilton, MD

## 2023-11-04 NOTE — Assessment & Plan Note (Signed)
 Continue steroid nasal spray and zyrtec  as directed. Follow.

## 2024-02-02 ENCOUNTER — Encounter: Payer: Self-pay | Admitting: Internal Medicine

## 2024-03-07 ENCOUNTER — Encounter: Payer: Self-pay | Admitting: Internal Medicine

## 2024-03-07 ENCOUNTER — Ambulatory Visit: Admitting: Internal Medicine

## 2024-03-07 VITALS — BP 106/64 | HR 92 | Temp 97.9°F | Resp 17 | Ht 61.0 in | Wt 164.8 lb

## 2024-03-07 DIAGNOSIS — Z789 Other specified health status: Secondary | ICD-10-CM

## 2024-03-07 DIAGNOSIS — F439 Reaction to severe stress, unspecified: Secondary | ICD-10-CM

## 2024-03-07 DIAGNOSIS — Z13220 Encounter for screening for lipoid disorders: Secondary | ICD-10-CM

## 2024-03-07 DIAGNOSIS — J45909 Unspecified asthma, uncomplicated: Secondary | ICD-10-CM

## 2024-03-07 DIAGNOSIS — Z23 Encounter for immunization: Secondary | ICD-10-CM

## 2024-03-07 DIAGNOSIS — R5383 Other fatigue: Secondary | ICD-10-CM | POA: Insufficient documentation

## 2024-03-07 DIAGNOSIS — J069 Acute upper respiratory infection, unspecified: Secondary | ICD-10-CM

## 2024-03-07 LAB — CBC WITH DIFFERENTIAL/PLATELET
Basophils Absolute: 0.1 K/uL (ref 0.0–0.1)
Basophils Relative: 0.9 % (ref 0.0–3.0)
Eosinophils Absolute: 0.1 K/uL (ref 0.0–0.7)
Eosinophils Relative: 1.9 % (ref 0.0–5.0)
HCT: 39 % (ref 36.0–46.0)
Hemoglobin: 13.6 g/dL (ref 12.0–15.0)
Lymphocytes Relative: 32.8 % (ref 12.0–46.0)
Lymphs Abs: 2.3 K/uL (ref 0.7–4.0)
MCHC: 34.8 g/dL (ref 30.0–36.0)
MCV: 83.4 fl (ref 78.0–100.0)
Monocytes Absolute: 0.7 K/uL (ref 0.1–1.0)
Monocytes Relative: 10.1 % (ref 3.0–12.0)
Neutro Abs: 3.9 K/uL (ref 1.4–7.7)
Neutrophils Relative %: 54.3 % (ref 43.0–77.0)
Platelets: 285 K/uL (ref 150.0–400.0)
RBC: 4.68 Mil/uL (ref 3.87–5.11)
RDW: 13.4 % (ref 11.5–15.5)
WBC: 7.1 K/uL (ref 4.0–10.5)

## 2024-03-07 LAB — COMPREHENSIVE METABOLIC PANEL WITH GFR
ALT: 11 U/L (ref 0–35)
AST: 13 U/L (ref 0–37)
Albumin: 4.2 g/dL (ref 3.5–5.2)
Alkaline Phosphatase: 26 U/L — ABNORMAL LOW (ref 39–117)
BUN: 11 mg/dL (ref 6–23)
CO2: 26 meq/L (ref 19–32)
Calcium: 9.2 mg/dL (ref 8.4–10.5)
Chloride: 104 meq/L (ref 96–112)
Creatinine, Ser: 0.83 mg/dL (ref 0.40–1.20)
GFR: 98.72 mL/min (ref >60.00)
Glucose, Bld: 85 mg/dL (ref 70–99)
Potassium: 4.1 meq/L (ref 3.5–5.1)
Sodium: 139 meq/L (ref 135–145)
Total Bilirubin: 0.4 mg/dL (ref 0.2–1.2)
Total Protein: 6.7 g/dL (ref 6.0–8.3)

## 2024-03-07 LAB — LIPID PANEL
Cholesterol: 173 mg/dL (ref 0–200)
HDL: 46.9 mg/dL (ref >39.00)
LDL Cholesterol: 99 mg/dL (ref 0–99)
NonHDL: 125.97
Total CHOL/HDL Ratio: 4
Triglycerides: 137 mg/dL (ref 0.0–149.0)
VLDL: 27.4 mg/dL (ref 0.0–40.0)

## 2024-03-07 LAB — TSH: TSH: 1.44 u[IU]/mL (ref 0.35–5.50)

## 2024-03-07 MED ORDER — SERTRALINE HCL 100 MG PO TABS: 100.0000 mg | ORAL_TABLET | Freq: Every day | ORAL | 1 refills | Status: AC

## 2024-03-07 MED ORDER — AZELASTINE HCL 0.1 % NA SOLN: 1.0000 | Freq: Two times a day (BID) | NASAL | 0 refills | Status: DC

## 2024-03-07 MED ORDER — AZITHROMYCIN 250 MG PO TABS: ORAL_TABLET | ORAL | 0 refills | Status: AC

## 2024-03-07 NOTE — Assessment & Plan Note (Signed)
 Tolerating ocp's. Taking regularly.

## 2024-03-07 NOTE — Progress Notes (Signed)
 Subjective:    Patient ID: Deborah Lopez, female    DOB: 24-Aug-2000, 24 y.o.   MRN: [id number]  Patient here for  Chief Complaint  Patient presents with   Medical Management of Chronic Issues    4 mth f/u and discuss weight    HPI Here for a scheduled follow up - follow up regarding increased anxiety - on zoloft . Doing well on zoloft . Feels this dose is working well for her. She is also concerned regarding some increased fatigue. Wanted to discuss her weight. She is watching her diet. Doing yoga. Walking her dog. Also reports increased cough - two weeks. Increased nasal congestion and drainage. No chest congestion or sob, but does report productive colored mucus (green). Taking dayquil and nyquil. Using flonase . Symptoms progressing despite using above. No vomiting or diarrhea reported. No fever.    Past Medical History:  Diagnosis Date   Anxiety    Asthma    Depression    History reviewed. No pertinent surgical history. Family History  Problem Relation Age of Onset   Arthritis Mother    Depression Mother    Hypertension Mother    Mental illness Mother    Miscarriages / Stillbirths Mother    Alcohol abuse Father    Depression Father    Diabetes Father    Early death Father    Mental illness Father    Asthma Sister    Depression Sister    Anxiety disorder Sister    Miscarriages / Stillbirths Sister    Mental illness Sister    Arthritis Maternal Grandmother    Cancer Maternal Grandfather    Heart attack Maternal Grandfather    Mental illness Paternal Grandmother    Social History   Socioeconomic History   Marital status: Significant Other    Spouse name: Not on file   Number of children: Not on file   Years of education: Not on file   Highest education level: Bachelor's degree (e.g., BA, AB, BS)  Occupational History   Not on file  Tobacco Use   Smoking status: Never   Smokeless tobacco: Never  Vaping Use   Vaping status: Never Used  Substance and Sexual  Activity   Alcohol use: Yes    Comment: social   Drug use: Never   Sexual activity: Not Currently    Comment: Junel  Other Topics Concern   Not on file  Social History Narrative   Not on file   Social Drivers of Health   Financial Resource Strain: Low Risk  (03/05/2024)   Overall Financial Resource Strain (CARDIA)    Difficulty of Paying Living Expenses: Not very hard  Food Insecurity: No Food Insecurity (03/05/2024)   Hunger Vital Sign    Worried About Running Out of Food in the Last Year: Never true    Ran Out of Food in the Last Year: Never true  Transportation Needs: No Transportation Needs (03/05/2024)   PRAPARE - Administrator, Civil Service (Medical): No    Lack of Transportation (Non-Medical): No  Physical Activity: Insufficiently Active (03/05/2024)   Exercise Vital Sign    Days of Exercise per Week: 3 days    Minutes of Exercise per Session: 40 min  Stress: Stress Concern Present (03/05/2024)   [employer] of Occupational Health - Occupational Stress Questionnaire    Feeling of Stress: To some extent  Social Connections: Moderately Isolated (03/05/2024)   Social Connection and Isolation Panel    Frequency of Communication with  Friends and Family: More than three times a week    Frequency of Social Gatherings with Friends and Family: More than three times a week    Attends Religious Services: Never    Database Administrator or Organizations: No    Attends Engineer, Structural: Not on file    Marital Status: Living with partner     Review of Systems  Constitutional:  Positive for fatigue. Negative for appetite change and fever.  HENT:  Positive for congestion and postnasal drip. Negative for sinus pressure.   Respiratory:  Positive for cough. Negative for chest tightness and shortness of breath.   Cardiovascular:  Negative for chest pain, palpitations and leg swelling.  Gastrointestinal:  Negative for abdominal pain, diarrhea, nausea  and vomiting.  Genitourinary:  Negative for difficulty urinating and dysuria.  Musculoskeletal:  Negative for joint swelling and myalgias.  Skin:  Negative for color change and rash.  Neurological:  Negative for dizziness and headaches.  Psychiatric/Behavioral:  Negative for agitation and dysphoric mood.        Objective:     BP 106/64   Pulse 92   Temp 97.9 F (36.6 C) (Oral)   Resp 17   Ht 5' 1 (1.549 m)   Wt 164 lb 12 oz (74.7 kg)   SpO2 98%   BMI 31.13 kg/m  Wt Readings from Last 3 Encounters:  03/07/24 164 lb 12 oz (74.7 kg)  11/04/23 167 lb 6.4 oz (75.9 kg)  08/03/23 169 lb (76.7 kg)    Physical Exam Vitals reviewed.  Constitutional:      General: She is not in acute distress.    Appearance: Normal appearance.  HENT:     Head: Normocephalic and atraumatic.     Right Ear: External ear normal.     Left Ear: External ear normal.     Mouth/Throat:     Pharynx: No oropharyngeal exudate or posterior oropharyngeal erythema.  Eyes:     General: No scleral icterus.       Right eye: No discharge.        Left eye: No discharge.     Conjunctiva/sclera: Conjunctivae normal.  Neck:     Thyroid: No thyromegaly.  Cardiovascular:     Rate and Rhythm: Normal rate and regular rhythm.  Pulmonary:     Effort: No respiratory distress.     Breath sounds: Normal breath sounds. No wheezing.  Abdominal:     General: Bowel sounds are normal.     Palpations: Abdomen is soft.     Tenderness: There is no abdominal tenderness.  Musculoskeletal:        General: No swelling or tenderness.     Cervical back: Neck supple. No tenderness.  Lymphadenopathy:     Cervical: No cervical adenopathy.  Skin:    Findings: No erythema or rash.  Neurological:     Mental Status: She is alert.  Psychiatric:        Mood and Affect: Mood normal.        Behavior: Behavior normal.         Outpatient Encounter Medications as of 03/07/2024  Medication Sig   AUROVELA 1/20 1-20 MG-MCG tablet  Take 1 tablet by mouth daily.   azelastine (ASTELIN) 0.1 % nasal spray Place 1 spray into both nostrils 2 (two) times daily. Use in each nostril as directed   azithromycin  (ZITHROMAX ) 250 MG tablet Take 2 tablets on day 1, then 1 tablet daily on days 2 through 5  sertraline  (ZOLOFT ) 100 MG tablet Take 1 tablet (100 mg total) by mouth daily.   [DISCONTINUED] sertraline  (ZOLOFT ) 100 MG tablet Take 1 tablet (100 mg total) by mouth daily.   No facility-administered encounter medications on file as of 03/07/2024.     Lab Results  Component Value Date   WBC 8.7 12/31/2022   HGB 13.9 12/31/2022   HCT 41.5 12/31/2022   PLT 377.0 12/31/2022   GLUCOSE 81 12/31/2022   CHOL 137 12/31/2022   TRIG 117.0 12/31/2022   HDL 50.00 12/31/2022   LDLCALC 64 12/31/2022   ALT 21 12/31/2022   AST 17 12/31/2022   NA 139 12/31/2022   K 3.9 12/31/2022   CL 105 12/31/2022   CREATININE 0.80 12/31/2022   BUN 9 12/31/2022   CO2 25 12/31/2022   TSH 1.08 12/31/2022    No results found.     Assessment & Plan:  Flu vaccine need  Screening cholesterol level -     Lipid panel  Stress Assessment & Plan: Doing well on zoloft . Feels better. Follow. Continue current dose.   Orders: -     TSH -     Comprehensive metabolic panel with GFR -     CBC with Differential/Platelet  Upper respiratory tract infection, unspecified type Assessment & Plan: Persistent symptoms despite flonase  and otc dayquil/nyquil. Will treat with zpak as directed. Add astelin nasal spray and continue flonase . Stop dayquil and nyquil. Robitussin DM as directed. Follow.  Call with  update.    Uses birth control Assessment & Plan: Tolerating ocp's. Taking regularly.    Mild asthma without complication, unspecified whether persistent Assessment & Plan: Diagnosed as a child. Treat infection. No wheezing noted. Follow.    Other fatigue Assessment & Plan: Some fatigue. Check cbc, met c and tsh. Continue exercise and diet  adjustment. Follow.    Other orders -     Sertraline  HCl; Take 1 tablet (100 mg total) by mouth daily.  Dispense: 90 tablet; Refill: 1 -     Azithromycin ; Take 2 tablets on day 1, then 1 tablet daily on days 2 through 5  Dispense: 6 tablet; Refill: 0 -     Azelastine HCl; Place 1 spray into both nostrils 2 (two) times daily. Use in each nostril as directed  Dispense: 30 mL; Refill: 0     Allena Hamilton, MD

## 2024-03-07 NOTE — Assessment & Plan Note (Signed)
 Doing well on zoloft . Feels better. Follow. Continue current dose.

## 2024-03-07 NOTE — Assessment & Plan Note (Signed)
 Diagnosed as a child. Treat infection. No wheezing noted. Follow.

## 2024-03-07 NOTE — Assessment & Plan Note (Signed)
 Persistent symptoms despite flonase  and otc dayquil/nyquil. Will treat with zpak as directed. Add astelin nasal spray and continue flonase . Stop dayquil and nyquil. Robitussin DM as directed. Follow.  Call with  update.

## 2024-03-07 NOTE — Assessment & Plan Note (Signed)
 Some fatigue. Check cbc, met c and tsh. Continue exercise and diet adjustment. Follow.

## 2024-03-07 NOTE — Patient Instructions (Signed)
 Continue flonase  nasal spray.  Astelin nasal spray - 1 spray each nostril twice a day  Robitussin DM for cough and congestion.   Zpak as directed.

## 2024-03-08 ENCOUNTER — Ambulatory Visit: Payer: Self-pay | Admitting: Internal Medicine

## 2024-03-08 DIAGNOSIS — Z6831 Body mass index (BMI) 31.0-31.9, adult: Secondary | ICD-10-CM

## 2024-03-29 ENCOUNTER — Other Ambulatory Visit: Payer: Self-pay | Admitting: Internal Medicine

## 2024-03-29 NOTE — Telephone Encounter (Signed)
 Rx sent in for 90 days astelin .

## 2024-04-05 NOTE — Telephone Encounter (Signed)
 Please call her and let her know that I would hold on over the counter medication at this time. I can refer her to [facility]. This is one on one - working with a provider - to help with weight loss. If interested, can place order for referral.

## 2024-04-06 NOTE — Telephone Encounter (Signed)
 Order placed for referral to weight management program.

## 2024-04-26 ENCOUNTER — Telehealth: Admitting: Family Medicine

## 2024-04-26 DIAGNOSIS — J019 Acute sinusitis, unspecified: Secondary | ICD-10-CM

## 2024-04-26 DIAGNOSIS — B9689 Other specified bacterial agents as the cause of diseases classified elsewhere: Secondary | ICD-10-CM | POA: Diagnosis not present

## 2024-04-26 MED ORDER — DOXYCYCLINE HYCLATE 100 MG PO TABS: 100.0000 mg | ORAL_TABLET | Freq: Two times a day (BID) | ORAL | 0 refills | Status: AC

## 2024-04-26 NOTE — Progress Notes (Signed)
 E-Visit for Sinus Problems  We are sorry that you are not feeling well.  Here is how we plan to help!  Based on what you have shared with me it looks like you have sinusitis.  Sinusitis is inflammation and infection in the sinus cavities of the head.  Based on your presentation I believe you most likely have Acute Bacterial Sinusitis.  This is an infection caused by bacteria and is treated with antibiotics. I have prescribed Doxycycline  100mg  by mouth twice a day for 7 days. You may use an oral decongestant such as Mucinex  D or if you have glaucoma or high blood pressure use plain Mucinex . Saline nasal spray help and can safely be used as often as needed for congestion.  If you develop worsening sinus pain, fever or notice severe headache and vision changes, or if symptoms are not better after completion of antibiotic, please schedule an appointment with a health care provider.    Sinus infections are not as easily transmitted as other respiratory infection, however we still recommend that you avoid close contact with loved ones, especially the very young and elderly.  Remember to wash your hands thoroughly throughout the day as this is the number one way to prevent the spread of infection!  Home Care: Only take medications as instructed by your medical team. Complete the entire course of an antibiotic. Do not take these medications with alcohol. A steam or ultrasonic humidifier can help congestion.  You can place a towel over your head and breathe in the steam from hot water  coming from a faucet. Avoid close contacts especially the very young and the elderly. Cover your mouth when you cough or sneeze. Always remember to wash your hands.  Get Help Right Away If: You develop worsening fever or sinus pain. You develop a severe head ache or visual changes. Your symptoms persist after you have completed your treatment plan.  Make sure you Understand these instructions. Will watch your  condition. Will get help right away if you are not doing well or get worse.  Your e-visit answers were reviewed by a board certified advanced clinical practitioner to complete your personal care plan.  Depending on the condition, your plan could have included both over the counter or prescription medications.  If there is a problem please reply  once you have received a response from your provider.  Your safety is important to us .  If you have drug allergies check your prescription carefully.    You can use MyChart to ask questions about today's visit, request a non-urgent call back, or ask for a work or school excuse for 24 hours related to this e-Visit. If it has been greater than 24 hours you will need to follow up with your provider, or enter a new e-Visit to address those concerns.  You will get an e-mail in the next two days asking about your experience.  I hope that your e-visit has been valuable and will speed your recovery. Thank you for using e-visits.  I have spent 5 minutes in review of e-visit questionnaire, review and updating patient chart, medical decision making and response to patient.   Shenna Brissette, FNP

## 2024-05-04 NOTE — Telephone Encounter (Signed)
 See if she can come in or do a virtual at 10:00 on Monday 05/09/24 to discuss. (If this time is not good, there are other time slots for that day).

## 2024-05-06 NOTE — Telephone Encounter (Signed)
 See attached message regarding appt. Please place on schedule. Thanks

## 2024-05-09 ENCOUNTER — Ambulatory Visit: Admitting: Internal Medicine

## 2024-05-11 ENCOUNTER — Encounter: Payer: Self-pay | Admitting: Internal Medicine

## 2024-05-11 ENCOUNTER — Telehealth: Admitting: Internal Medicine

## 2024-05-11 MED ORDER — TIRZEPATIDE-WEIGHT MANAGEMENT 2.5 MG/0.5ML ~~LOC~~ SOLN: 2.5000 mg | SUBCUTANEOUS | 2 refills | Status: AC

## 2024-05-11 NOTE — Progress Notes (Unsigned)
 Patient ID: Deborah Lopez, female   DOB: Jun 26, 2000, 24 y.o.   MRN: [id number]   Virtual Visit via video Note  I connected with Deborah Lopez by a video enabled telemedicine application and verified that I am speaking with the correct person using two identifiers. Location patient: home Location provider: work o Persons participating in the virtual visit: patient, provider  The limitations, risks, security and privacy concerns of performing an evaluation and management service by video and the availability of in person appointments have been discussed. It has also been discussed with the patient that there may be a patient responsible charge related to this service. The patient expressed understanding and agreed to proceed.  Interactive audio and video telecommunications were attempted between this provider and patient, however failed, due to patient having technical difficulties OR patient did not have access to video capability.  We continued and completed visit with audio only. ***  Reason for visit: work in appt.   HPI: Work in to discuss weight loss. I had discussed referral to healthy weight and wellness. She called back in requesting to discuss weight loss medications, specifically GLP 1 agonist.    ROS: See pertinent positives and negatives per HPI.  Past Medical History:  Diagnosis Date   Anxiety    Asthma    Depression     No past surgical history on file.  Family History  Problem Relation Age of Onset   Arthritis Mother    Depression Mother    Hypertension Mother    Mental illness Mother    Miscarriages / Stillbirths Mother    Alcohol abuse Father    Depression Father    Diabetes Father    Early death Father    Mental illness Father    Asthma Sister    Depression Sister    Anxiety disorder Sister    Miscarriages / Stillbirths Sister    Mental illness Sister    Arthritis Maternal Grandmother    Cancer Maternal Grandfather    Heart attack Maternal Grandfather     Mental illness Paternal Grandmother     SOCIAL HX: ***  Current Medications[1]  EXAM:  VITALS per patient if applicable:  GENERAL: alert, oriented, appears well and in no acute distress  HEENT: atraumatic, conjunttiva clear, no obvious abnormalities on inspection of external nose and ears  NECK: normal movements of the head and neck  LUNGS: on inspection no signs of respiratory distress, breathing rate appears normal, no obvious gross SOB, gasping or wheezing  CV: no obvious cyanosis  MS: moves all visible extremities without noticeable abnormality  PSYCH/NEURO: pleasant and cooperative, no obvious depression or anxiety, speech and thought processing grossly intact  ASSESSMENT AND PLAN:  Discussed the following assessment and plan:  Problem List Items Addressed This Visit   None   No follow-ups on file.   I discussed the assessment and treatment plan with the patient. The patient was provided an opportunity to ask questions and all were answered. The patient agreed with the plan and demonstrated an understanding of the instructions.   The patient was advised to call back or seek an in-person evaluation if the symptoms worsen or if the condition fails to improve as anticipated.  I provided *** minutes of non-face-to-face time during this encounter.   Allena Hamilton, MD      [1]  Current Outpatient Medications:    AUROVELA 1/20 1-20 MG-MCG tablet, Take 1 tablet by mouth daily., Disp: , Rfl:    Azelastine  HCl 137  MCG/SPRAY SOLN, PLACE 1 SPRAY INTO BOTH NOSTRILS 2 (TWO) TIMES DAILY. USE IN EACH NOSTRIL AS DIRECTED, Disp: 90 mL, Rfl: 0   sertraline  (ZOLOFT ) 100 MG tablet, Take 1 tablet (100 mg total) by mouth daily., Disp: 90 tablet, Rfl: 1

## 2024-05-11 NOTE — Telephone Encounter (Signed)
 She sent attached. Can send to PA team and see if covered. If not, let her know - not covered and see if she wants me to send in zepbound  to [facility]. Can explain to her that through [facility] - zepbound  will be in a vial - will need to draw up from [facility].

## 2024-05-12 NOTE — Telephone Encounter (Signed)
"  PA needed for Zepbound     "

## 2024-07-06 ENCOUNTER — Ambulatory Visit: Admitting: Internal Medicine
